# Patient Record
Sex: Female | Born: 1945 | Hispanic: Yes | Marital: Married | Smoking: Never smoker
Health system: Western US, Academic
[De-identification: ages and names within clinical notes are randomized; demographics above are authoritative.]

## PROBLEM LIST (undated history)

## (undated) DIAGNOSIS — I10 Essential (primary) hypertension: Secondary | ICD-10-CM

## (undated) DIAGNOSIS — E7800 Pure hypercholesterolemia, unspecified: Secondary | ICD-10-CM

## (undated) DIAGNOSIS — E039 Hypothyroidism, unspecified: Secondary | ICD-10-CM

## (undated) DIAGNOSIS — F32A Depression, unspecified: Secondary | ICD-10-CM

## (undated) DIAGNOSIS — E119 Type 2 diabetes mellitus without complications: Secondary | ICD-10-CM

## (undated) DIAGNOSIS — G4733 Obstructive sleep apnea (adult) (pediatric): Secondary | ICD-10-CM

## (undated) DIAGNOSIS — G510 Bell's palsy: Secondary | ICD-10-CM

## (undated) DIAGNOSIS — K219 Gastro-esophageal reflux disease without esophagitis: Secondary | ICD-10-CM

## (undated) DIAGNOSIS — F329 Major depressive disorder, single episode, unspecified: Secondary | ICD-10-CM

## (undated) HISTORY — PX: HYSTERECTOMY: AMBSHX0005

---

## 2016-11-24 LAB — HAPPY TOGETHER UNMAPPED RESULTS: aPTT: 27.6 second(s) — NL (ref 23.8–36.1)

## 2018-01-20 LAB — HAPPY TOGETHER UNMAPPED RESULTS: Albumin: 4.3 g/dL — NL (ref 3.7–5.7)

## 2018-05-07 ENCOUNTER — Emergency Department: Payer: Medicare (Managed Care)

## 2018-05-07 ENCOUNTER — Inpatient Hospital Stay: Payer: Self-pay

## 2018-05-07 ENCOUNTER — Inpatient Hospital Stay
Admission: EM | Admit: 2018-05-07 | Discharge: 2018-05-14 | DRG: 579 | Disposition: A | Discharge status: Home / Self Care | Payer: Medicare (Managed Care) | Attending: Internal Medicine | Admitting: Internal Medicine

## 2018-05-07 DIAGNOSIS — E860 Dehydration: Secondary | ICD-10-CM | POA: Insufficient documentation

## 2018-05-07 DIAGNOSIS — N179 Acute kidney failure, unspecified: Secondary | ICD-10-CM

## 2018-05-07 DIAGNOSIS — I129 Hypertensive chronic kidney disease with stage 1 through stage 4 chronic kidney disease, or unspecified chronic kidney disease: Secondary | ICD-10-CM

## 2018-05-07 DIAGNOSIS — E1065 Type 1 diabetes mellitus with hyperglycemia: Secondary | ICD-10-CM

## 2018-05-07 DIAGNOSIS — L728 Other follicular cysts of the skin and subcutaneous tissue: Secondary | ICD-10-CM

## 2018-05-07 DIAGNOSIS — R1084 Generalized abdominal pain: Secondary | ICD-10-CM

## 2018-05-07 DIAGNOSIS — Z9119 Patient's noncompliance with other medical treatment and regimen: Secondary | ICD-10-CM

## 2018-05-07 DIAGNOSIS — E1022 Type 1 diabetes mellitus with diabetic chronic kidney disease: Secondary | ICD-10-CM

## 2018-05-07 DIAGNOSIS — E1010 Type 1 diabetes mellitus with ketoacidosis without coma: Secondary | ICD-10-CM

## 2018-05-07 DIAGNOSIS — B9562 Methicillin resistant Staphylococcus aureus infection as the cause of diseases classified elsewhere: Secondary | ICD-10-CM

## 2018-05-07 DIAGNOSIS — G510 Bell's palsy: Secondary | ICD-10-CM

## 2018-05-07 DIAGNOSIS — R112 Nausea with vomiting, unspecified: Secondary | ICD-10-CM

## 2018-05-07 DIAGNOSIS — N189 Chronic kidney disease, unspecified: Secondary | ICD-10-CM

## 2018-05-07 DIAGNOSIS — L02811 Cutaneous abscess of head [any part, except face]: Secondary | ICD-10-CM

## 2018-05-07 HISTORY — DX: Obstructive sleep apnea (adult) (pediatric): G47.33

## 2018-05-07 HISTORY — DX: Pure hypercholesterolemia, unspecified: E78.00

## 2018-05-07 HISTORY — DX: Bell's palsy: G51.0

## 2018-05-07 HISTORY — DX: Depression, unspecified: F32.A

## 2018-05-07 HISTORY — DX: Hypothyroidism, unspecified: E03.9

## 2018-05-07 HISTORY — DX: Type 2 diabetes mellitus without complications: E11.9

## 2018-05-07 HISTORY — DX: Gastro-esophageal reflux disease without esophagitis: K21.9

## 2018-05-07 HISTORY — DX: Major depressive disorder, single episode, unspecified: F32.9

## 2018-05-07 HISTORY — DX: Essential (primary) hypertension: I10

## 2018-05-07 LAB — CBC WITH DIFFERENTIAL
Basophils % Auto: 0.2 % (ref 0.0–1.0)
Basophils Abs Auto: 0 10*3/uL (ref 0.0–0.1)
Eosinophils % Auto: 0.2 % (ref 0.0–4.0)
Eosinophils Abs Auto: 0 10*3/uL (ref 0.0–0.2)
Hematocrit: 36.5 % (ref 36.0–48.0)
Hematocrit: 26.5 % — ABNORMAL LOW (ref 36.0–48.0)
Hemoglobin: 12.4 g/dL (ref 12.0–16.0)
Hemoglobin: 9.2 g/dL — ABNORMAL LOW (ref 12.0–16.0)
Immature Granulocytes % Auto: 0.8 % — ABNORMAL HIGH (ref 0.00–0.50)
Immature Granulocytes Abs Auto: 0.1 10*3/uL — ABNORMAL HIGH (ref 0.0–0.0)
Lymphocytes % Auto: 7.6 % (ref 5.0–41.0)
Lymphocytes Abs Auto: 1.3 10*3/uL (ref 1.3–2.9)
MCH: 32.4 pg (ref 27.0–34.0)
MCH: 32.3 pg (ref 27.0–34.0)
MCHC g/dL: 34 g/dL (ref 33.0–37.0)
MCHC g/dL: 34.7 g/dL (ref 33.0–37.0)
MCV: 95.3 fL (ref 82.0–97.0)
MCV: 93 fL (ref 82.0–97.0)
MPV: 8.8 fL — ABNORMAL LOW (ref 9.4–12.4)
MPV: 8.5 fL — ABNORMAL LOW (ref 9.4–12.4)
Monocytes % Auto: 7.6 % (ref 0.0–10.0)
Monocytes Abs Auto: 1.3 10*3/uL — ABNORMAL HIGH (ref 0.3–0.8)
Neutrophils % Auto: 83.6 % — ABNORMAL HIGH (ref 45.0–75.0)
Neutrophils Abs Auto: 14.44 10*3/uL — ABNORMAL HIGH (ref 2.20–4.80)
Nucleated Cell Count: 0 10*3/uL (ref 0.0–0.1)
Nucleated Cell Count: 0 10*3/uL (ref 0.0–0.1)
Nucleated RBC/100 WBC: 0 % WBC (ref <=0.0)
Nucleated RBC/100 WBC: 0 % WBC (ref <=0.0)
Platelet Count: 393 10*3/uL — ABNORMAL HIGH (ref 151–365)
Platelet Count: 257 10*3/uL (ref 151–365)
RDW: 12.9 % (ref 11.5–14.5)
RDW: 12.8 % (ref 11.5–14.5)
Red Blood Cell Count: 3.83 10*6/uL (ref 3.80–5.10)
Red Blood Cell Count: 2.85 10*6/uL — ABNORMAL LOW (ref 3.80–5.10)
White Blood Cell Count: 24.6 10*3/uL — ABNORMAL HIGH (ref 4.2–10.8)
White Blood Cell Count: 17.3 10*3/uL — ABNORMAL HIGH (ref 4.2–10.8)

## 2018-05-07 LAB — POC GLUCOSE
POC GLUCOSE: 223 mg/dL — AB (ref 70–99)
POC GLUCOSE: 226 mg/dL — AB (ref 70–99)
POC GLUCOSE: 277 mg/dL — AB (ref 70–99)
POC GLUCOSE: 300 mg/dL — AB (ref 70–99)
POC GLUCOSE: 362 mg/dL — AB (ref 70–99)
POC GLUCOSE: 582 mg/dL — AB (ref 70–99)

## 2018-05-07 LAB — COMPREHENSIVE METABOLIC PANEL
ADJUSTED CALCIUM, MMC: 9.7 mg/dL (ref 8.7–10.2)
ALANINE TRANSFERASE (ALT): 16 U/L (ref 4–56)
ALB/GLOB RATIO_MMC: 1 (ref 1.0–1.6)
ALBUMIN: 4.1 g/dL (ref 3.2–4.7)
ALKALINE PHOSPHATASE (ALP): 121 U/L (ref 38–126)
ASPARTATE TRANSAMINASE (AST): 21 U/L (ref 9–44)
BILIRUBIN TOTAL: 1.9 mg/dL (ref 0.1–2.2)
BUN/CREATININE_MMC: 12.8 (ref 7.3–21.7)
CALCIUM: 9.8 mg/dL (ref 8.7–10.2)
CHLORIDE: 90 mmol/L — AB (ref 99–109)
CREATININE BLOOD: 1.88 mg/dL — AB (ref 0.50–1.30)
E-GFR, NON-AFRICAN AMERICAN: 26 mL/min/{1.73_m2} — AB (ref 60–?)
E-GFR_MMC: 30 mL/min/{1.73_m2} — AB (ref 60–?)
GLOBULIN BLOOD_MMC: 4.2 g/dL (ref 2.2–4.2)
GLUCOSE: 729 mg/dL — AB (ref 70–99)
POTASSIUM: 5.2 mmol/L (ref 3.5–5.2)
PROTEIN: 8.3 g/dL — AB (ref 5.9–8.2)
SODIUM: 128 mmol/L — AB (ref 134–143)
UREA NITROGEN, BLOOD (BUN): 24 mg/dL — AB (ref 6–21)

## 2018-05-07 LAB — BLD GAS VENOUS
BASE EXCESS, VENM_MMC: -22 mmol/L — AB (ref <=3)
HCO3, VENM_MMC: 6 mmol/L — AB (ref 22–27)
O2 SAT, VEN: 77 % (ref 70–100)
PATIENT TEMP, VENMMC: 37.2
PCO2, VEN: 19 mmHg — AB (ref 40–52)
PH, VEN: 7.1 — AB (ref 7.31–7.41)
PO2, VEN: 52 mmHg — AB (ref 30–50)

## 2018-05-07 LAB — MAGNESIUM (MG)
MAGNESIUM (MG): 2.1 mg/dL (ref 1.8–2.5)
MAGNESIUM (MG): 1.9 mg/dL (ref 1.8–2.5)

## 2018-05-07 LAB — MMC  MANUAL DIFFERENTIAL
BASOPHILS %: 1 %
BASOPHILS ABS: 0.2 10*3/uL — AB (ref 0.0–0.1)
LYMPHOCYTES ABS: 1 10*3/uL — AB (ref 1.3–2.9)
LYMPHS %: 4 %
MONOCYTES %: 4 %
MONOCYTES ABS: 1 10*3/uL — AB (ref 0.3–0.8)
NEUTROPHIL ABS: 22.4 10*3/uL — AB (ref 2.2–4.8)
PLATELET ESTIMATE, SMEAR: INCREASED
POLYS (SEGS)%: 91 %
RBC-COLOR, SIZE,SHAPE: NORMAL
SAMPLE SIZE, WBC: 100

## 2018-05-07 LAB — LACTIC ACID
LACTIC ACID_MMC: 3.5 mmol/L — AB (ref 0.5–1.9)
LACTIC ACID_MMC: 2.2 mmol/L — AB (ref 0.5–1.9)

## 2018-05-07 LAB — BASIC METABOLIC PANEL
BUN/CREATININE_MMC: 16 (ref 7.3–21.7)
CALCIUM: 8 mg/dL — AB (ref 8.7–10.2)
CARBON DIOXIDE TOTAL: 7 mmol/L — AB (ref 22–32)
CHLORIDE: 105 mmol/L (ref 99–109)
CREATININE BLOOD: 1.44 mg/dL — AB (ref 0.50–1.30)
E-GFR, NON-AFRICAN AMERICAN: 36 mL/min/{1.73_m2} — AB (ref 60–?)
E-GFR_MMC: 42 mL/min/{1.73_m2} — AB (ref 60–?)
GLUCOSE: 453 mg/dL — AB (ref 70–99)
POTASSIUM: 4.3 mmol/L (ref 3.5–5.2)
SODIUM: 134 mmol/L (ref 134–143)
UREA NITROGEN, BLOOD (BUN): 23 mg/dL — AB (ref 6–21)

## 2018-05-07 LAB — URINALYSIS AND CULTURE IF IND
BILIRUBIN URINE: NEGATIVE
GLUCOSE URINE: 500 mg/dL — AB
KETONES_MMC: 80 — AB
LEUK. ESTERASE: NEGATIVE
NITRITE URINE: NEGATIVE
PH URINE: 5 (ref 5.0–9.0)
PROTEIN URINE: NEGATIVE mg/dL
RBC: 1 /HPF (ref <=3)
SPECIFIC GRAVITY URINE MMC: 1.02 (ref 1.002–1.030)
UROBILINOGEN.: NEGATIVE mg/dL

## 2018-05-07 LAB — TROPONIN I
TROPONIN I_MMC: 0.03 ng/mL (ref <=0.03)
TROPONIN I_MMC: 0.04 ng/mL — AB (ref <=0.03)

## 2018-05-07 LAB — MMC TOTAL PARENTERAL NUTRITION CHEM PNL
Adjusted Calcium: 8.6 mg/dL — ABNORMAL LOW (ref 8.7–10.2)
Alanine Transferase (ALT): 12 U/L (ref 4–56)
Alb/Glob Ratio: 1 (ref 1.0–1.6)
Albumin: 3.2 g/dL (ref 3.2–4.7)
Alkaline Phosphatase (ALP): 81 U/L (ref 38–126)
Aspartate Transaminase (AST): 10 U/L (ref 9–44)
BUN/ Creatinine: 17.5 (ref 7.3–21.7)
Bilirubin Total: 1.3 mg/dL (ref 0.1–2.2)
Calcium: 8 mg/dL — ABNORMAL LOW (ref 8.7–10.2)
Carbon Dioxide Total: 16 mmol/L — ABNORMAL LOW (ref 22–32)
Chloride: 107 mmol/L (ref 99–109)
Creatinine Serum: 1.14 mg/dL (ref 0.50–1.30)
E-GFR, Non-African American: 48 mL/min/{1.73_m2} — ABNORMAL LOW (ref >60)
E-GFR: 55 mL/min/{1.73_m2} — ABNORMAL LOW (ref >60)
Globulin: 3.1 g/dL (ref 2.2–4.2)
Glucose: 281 mg/dL — ABNORMAL HIGH (ref 70–99)
Magnesium (Mg): 1.7 mg/dL — ABNORMAL LOW (ref 1.8–2.5)
Phosphorus (PO4): 1.5 mg/dL — ABNORMAL LOW (ref 2.4–4.7)
Potassium: 4.1 mmol/L (ref 3.5–5.2)
Prealbumin: 10 mg/dL — ABNORMAL LOW (ref 18–38)
Protein: 6.3 g/dL (ref 5.9–8.2)
Sodium: 135 mmol/L (ref 134–143)
Triglyceride: 149 mg/dL (ref 30–150)
Urea Nitrogen, Blood (BUN): 20 mg/dL (ref 6–21)

## 2018-05-07 LAB — MMC MRSA BY PCR: MRSA BY PCR_MMC: NOT DETECTED

## 2018-05-07 LAB — BLUE TOP TUBE

## 2018-05-07 LAB — BETA-HYDROXYBUTYRATE

## 2018-05-07 LAB — PHOSPHORUS (PO4): PHOSPHORUS (PO4): 5.9 mg/dL — AB (ref 2.4–4.7)

## 2018-05-07 MED ORDER — NACL 0.9% IV BOLUS - DURATION REQ
1000.00 mL | INTRAVENOUS | Status: AC
Start: 2018-05-07 — End: 2018-05-07
  Administered 2018-05-07 (×2): 1000 mL via INTRAVENOUS

## 2018-05-07 MED ORDER — POTASS.CHLOR 10 MEQ-LIDOCAINE 10 MG/100 ML IN 0.9 % SODCH IV PIGGYBACK
10.00 meq | INJECTION | INTRAVENOUS | Status: DC | PRN
Start: 2018-05-07 — End: 2018-05-14

## 2018-05-07 MED ORDER — D5 / 0.45% NACL IV INFUSION
INTRAVENOUS | Status: DC | PRN
Start: 2018-05-07 — End: 2018-05-08
  Administered 2018-05-08: 100 mL via INTRAVENOUS

## 2018-05-07 MED ORDER — ALUMINUM-MAG HYDROXIDE-SIMETHICONE 200 MG-200 MG-20 MG/5 ML ORAL SUSP
30.00 mL | ORAL | Status: DC | PRN
Start: 2018-05-07 — End: 2018-05-14

## 2018-05-07 MED ORDER — NALOXONE 0.4 MG/ML INJECTION SOLUTION
0.20 mg | INTRAMUSCULAR | Status: DC | PRN
Start: 2018-05-07 — End: 2018-05-09

## 2018-05-07 MED ORDER — MAGNESIUM SULFATE 1 GRAM/100 ML IN DEXTROSE 5 % INTRAVENOUS PIGGYBACK
1.00 g | INJECTION | INTRAVENOUS | Status: DC | PRN
Start: 2018-05-07 — End: 2018-05-14
  Administered 2018-05-07: 1 g via INTRAVENOUS
  Filled 2018-05-07: qty 100, fill #0

## 2018-05-07 MED ORDER — INSULIN U-100 REGULAR HUMAN 100 UNIT/ML INJECTION SOLUTION
15.00 [IU] | Freq: Once | INTRAMUSCULAR | Status: AC
Start: 2018-05-07 — End: 2018-05-07
  Administered 2018-05-07: 15 [IU] via INTRAVENOUS

## 2018-05-07 MED ORDER — SODIUM BICARBONATE 8.4 % (1 MEQ/ML) INTRAVENOUS SYRINGE
50.00 meq | INJECTION | Freq: Once | INTRAVENOUS | Status: AC
Start: 2018-05-07 — End: 2018-05-07
  Administered 2018-05-07: 50 meq via INTRAVENOUS
  Filled 2018-05-07: qty 50, fill #0

## 2018-05-07 MED ORDER — DOCUSATE CALCIUM 240 MG CAPSULE
240.00 mg | ORAL_CAPSULE | Freq: Every day | ORAL | Status: DC
Start: 2018-05-08 — End: 2018-05-09
  Administered 2018-05-08 – 2018-05-09 (×2): 240 mg via ORAL
  Filled 2018-05-07 (×2): qty 1, fill #0

## 2018-05-07 MED ORDER — PIPERACILLIN-TAZOBACTAM 4.5 GRAM/100 ML DEXTROSE(ISO-OSM) IV PIGGYBACK
4.50 g | INJECTION | Freq: Once | INTRAVENOUS | Status: AC
Start: 2018-05-07 — End: 2018-05-07
  Administered 2018-05-07: 4.5 g via INTRAVENOUS
  Filled 2018-05-07: qty 4500, fill #0

## 2018-05-07 MED ORDER — SODIUM PHOSPHATE 3 MMOL/ML INTRAVENOUS SOLUTION
22.50 mmol | INTRAVENOUS | Status: DC | PRN
Start: 2018-05-07 — End: 2018-05-14

## 2018-05-07 MED ORDER — FENTANYL (PF) 50 MCG/ML INJECTION SOLUTION
50.00 ug | Freq: Once | INTRAMUSCULAR | Status: AC
Start: 2018-05-07 — End: 2018-05-07
  Administered 2018-05-07: 50 ug via INTRAVENOUS
  Filled 2018-05-07: qty 2, fill #0

## 2018-05-07 MED ORDER — HYDROMORPHONE 1 MG/ML INJECTION SYRINGE
0.50 mg | INJECTION | INTRAMUSCULAR | Status: AC | PRN
Start: 2018-05-07 — End: 2018-05-08
  Administered 2018-05-07 – 2018-05-08 (×4): 0.5 mg via INTRAVENOUS
  Filled 2018-05-07 (×4): qty 1, fill #0

## 2018-05-07 MED ORDER — ONDANSETRON HCL (PF) 4 MG/2 ML INJECTION SOLUTION
4.00 mg | Freq: Once | INTRAMUSCULAR | Status: AC
Start: 2018-05-07 — End: 2018-05-07
  Administered 2018-05-07: 4 mg via INTRAVENOUS
  Filled 2018-05-07: qty 2, fill #0

## 2018-05-07 MED ORDER — ONDANSETRON HCL (PF) 4 MG/2 ML INJECTION SOLUTION
4.00 mg | Freq: Three times a day (TID) | INTRAMUSCULAR | Status: DC | PRN
Start: 2018-05-07 — End: 2018-05-14

## 2018-05-07 MED ORDER — DEXTROSE 50 % IN WATER (D50W) INTRAVENOUS SYRINGE
25.00 g | INJECTION | INTRAVENOUS | Status: DC | PRN
Start: 2018-05-07 — End: 2018-05-08

## 2018-05-07 MED ORDER — PIPERACILLIN-TAZOBACTAM 3.375 GRAM IV EXTENDED INFUSION (PREMADE)
3.38 g | INJECTION | Freq: Three times a day (TID) | INTRAVENOUS | Status: DC
Start: 2018-05-07 — End: 2018-05-10
  Administered 2018-05-07 – 2018-05-10 (×9): 3.375 g via INTRAVENOUS
  Filled 2018-05-07 (×9): qty 50, fill #0

## 2018-05-07 MED ORDER — PROMETHAZINE 25 MG/ML INJECTION SOLUTION
12.50 mg | Freq: Once | INTRAMUSCULAR | Status: AC
Start: 2018-05-07 — End: 2018-05-07
  Administered 2018-05-07: 12.5 mg via INTRAVENOUS
  Filled 2018-05-07: qty 1, fill #0

## 2018-05-07 MED ORDER — SODIUM CHLORIDE 0.9 % (FLUSH) INJECTION SYRINGE
2.50 mL | INJECTION | Freq: Three times a day (TID) | INTRAMUSCULAR | Status: DC
Start: 2018-05-07 — End: 2018-05-09
  Administered 2018-05-08 – 2018-05-09 (×4): 2.5 mL via INTRAVENOUS

## 2018-05-07 MED ORDER — BISACODYL 10 MG RECTAL SUPPOSITORY
10.00 mg | RECTAL | Status: DC | PRN
Start: 2018-05-07 — End: 2018-05-09

## 2018-05-07 MED ORDER — INSULIN REGULAR 100 UNIT/100 ML (1 UNIT/ML) IN 0.9 % NACL IV SOLUTION
1.00 [IU]/h | INTRAVENOUS | Status: DC
Start: 2018-05-07 — End: 2018-05-08
  Administered 2018-05-07: 3.375 [IU]/h via INTRAVENOUS
  Administered 2018-05-07: 6 [IU]/h via INTRAVENOUS

## 2018-05-07 MED ORDER — MELATONIN 3 MG TABLET
3.00 mg | ORAL_TABLET | Freq: Every day | ORAL | Status: DC | PRN
Start: 2018-05-07 — End: 2018-05-09
  Administered 2018-05-08: 3 mg via ORAL
  Filled 2018-05-07: qty 1, fill #0

## 2018-05-07 MED ORDER — VANCOMYCIN 1.25 GRAM/250 ML IN 0.9 % SODIUM CHLORIDE INTRAVENOUS
1.25 g | INTRAVENOUS | Status: DC
Start: 2018-05-08 — End: 2018-05-09
  Administered 2018-05-08: 1.25 g via INTRAVENOUS
  Filled 2018-05-07 (×2): qty 250, fill #0

## 2018-05-07 MED ORDER — VANCOMYCIN 1 GRAM/250 ML IN 0.9 % SODIUM CHLORIDE INTRAVENOUS
1.00 g | Freq: Once | INTRAVENOUS | Status: DC
Start: 2018-05-07 — End: 2018-05-07

## 2018-05-07 MED ORDER — GLUCAGON (HUMAN RECOMBINANT) 1 MG/ML SOLUTION FOR INJECTION
1.00 mg | INTRAMUSCULAR | Status: DC | PRN
Start: 2018-05-07 — End: 2018-05-08

## 2018-05-07 MED ORDER — POTASSIUM CHLORIDE 20 MEQ/100ML IN STERILE WATER INTRAVENOUS PIGGYBACK
20.00 meq | INJECTION | INTRAVENOUS | Status: DC | PRN
Start: 2018-05-07 — End: 2018-05-14

## 2018-05-07 MED ORDER — VANCOMYCIN 1.25 GRAM/250 ML IN 0.9 % SODIUM CHLORIDE INTRAVENOUS
1.25 g | Freq: Once | INTRAVENOUS | Status: AC
Start: 2018-05-07 — End: 2018-05-07
  Administered 2018-05-07: 1.25 g via INTRAVENOUS
  Filled 2018-05-07: qty 250, fill #0

## 2018-05-07 MED ORDER — PIPERACILLIN-TAZOBACTAM 3.375 GRAM/50 ML DEXTROSE(ISO-OS) IV PIGGYBACK
3.38 g | INJECTION | Freq: Four times a day (QID) | INTRAVENOUS | Status: DC
Start: 2018-05-07 — End: 2018-05-07

## 2018-05-07 MED ORDER — MAGNESIUM HYDROXIDE 400 MG/5 ML ORAL SUSPENSION
30.00 mL | ORAL | Status: DC | PRN
Start: 2018-05-07 — End: 2018-05-09

## 2018-05-07 MED ORDER — VANCOMYCIN RX TO DOSE (CC)
Status: DC
Start: 2018-05-07 — End: 2018-05-14

## 2018-05-07 MED ORDER — ACETAMINOPHEN 325 MG TABLET
650.00 mg | ORAL_TABLET | Freq: Four times a day (QID) | ORAL | Status: DC | PRN
Start: 2018-05-07 — End: 2018-05-14

## 2018-05-07 MED ORDER — ENOXAPARIN 40 MG/0.4 ML SUBCUTANEOUS SYRINGE
40.00 mg | INJECTION | SUBCUTANEOUS | Status: DC
Start: 2018-05-08 — End: 2018-05-09
  Administered 2018-05-08: 40 mg via SUBCUTANEOUS
  Filled 2018-05-07: qty 0.4, fill #0

## 2018-05-07 MED ORDER — SODIUM CHLORIDE 0.9 % (FLUSH) INJECTION SYRINGE
2.50 mL | INJECTION | INTRAMUSCULAR | Status: DC | PRN
Start: 2018-05-07 — End: 2018-05-09

## 2018-05-07 MED ORDER — LIDOCAINE HCL 10 MG/ML (1 %) INJECTION SOLUTION
0.20 mL | INTRAMUSCULAR | Status: DC | PRN
Start: 2018-05-07 — End: 2018-05-14
  Filled 2018-05-07: qty 10, fill #0

## 2018-05-07 MED ORDER — PANTOPRAZOLE 40 MG INTRAVENOUS SOLUTION
40.00 mg | Freq: Two times a day (BID) | INTRAVENOUS | Status: DC
Start: 2018-05-07 — End: 2018-05-11
  Administered 2018-05-07 – 2018-05-11 (×8): 40 mg via INTRAVENOUS
  Filled 2018-05-07: qty 40, fill #0
  Filled 2018-05-07: qty 40
  Filled 2018-05-07 (×6): qty 40, fill #0

## 2018-05-07 MED ORDER — NACL 0.9% IV INFUSION
INTRAVENOUS | Status: DC
Start: 2018-05-07 — End: 2018-05-08
  Administered 2018-05-07: 18:00:00 via INTRAVENOUS

## 2018-05-07 MED ORDER — VANCOMYCIN 1 GRAM/250 ML IN 0.9 % SODIUM CHLORIDE INTRAVENOUS
1.00 g | INTRAVENOUS | Status: DC
Start: 2018-05-07 — End: 2018-05-07

## 2018-05-07 MED ORDER — NACL 0.9% IV BOLUS - DURATION REQ
500.00 mL | Freq: Once | INTRAVENOUS | Status: AC
Start: 2018-05-07 — End: 2018-05-07
  Administered 2018-05-07: 500 mL via INTRAVENOUS

## 2018-05-07 MED ORDER — LACTOBACILLUS RHAMNOSUS GG 15 BILLION CELL SPRINKLE CAPSULE
1.00 | ORAL_CAPSULE | Freq: Two times a day (BID) | ORAL | Status: DC
Start: 2018-05-07 — End: 2018-05-09
  Administered 2018-05-07 – 2018-05-09 (×4): 1 via ORAL
  Filled 2018-05-07 (×4): qty 1, fill #0

## 2018-05-07 MED ORDER — DEXTROSE 50 % IN WATER (D50W) INTRAVENOUS SYRINGE
7.50 g | INJECTION | INTRAVENOUS | Status: DC | PRN
Start: 2018-05-07 — End: 2018-05-08

## 2018-05-07 MED ORDER — SODIUM PHOSPHATE 7.5 MMOL/100 ML IN 0.9 % SODIUM CHLORIDE IV SOLUTION
7.50 mmol | INTRAVENOUS | Status: DC | PRN
Start: 2018-05-07 — End: 2018-05-14

## 2018-05-07 MED ORDER — SODIUM PHOSPHATE 15 MMOL/250 ML IN 0.9 % SODIUM CHLORIDE IV SOLUTION
15.00 mmol | INTRAVENOUS | Status: DC | PRN
Start: 2018-05-07 — End: 2018-05-14
  Administered 2018-05-07: 15 mmol via INTRAVENOUS
  Filled 2018-05-07: qty 250, fill #0

## 2018-05-07 MED ORDER — INSULIN REGULAR 100 UNIT/100 ML (1 UNIT/ML) IN 0.9 % NACL IV SOLUTION
3.00 [IU]/h | INTRAVENOUS | Status: DC
Start: 2018-05-07 — End: 2018-05-07

## 2018-05-07 MED ORDER — NALOXONE 0.4 MG/ML INJECTION SOLUTION
0.08 mg | INTRAMUSCULAR | Status: DC | PRN
Start: 2018-05-07 — End: 2018-05-09

## 2018-05-07 MED FILL — ENOXAPARIN 40 MG/0.4 ML SUBCUTANEOUS SYRINGE: 40.00 mg | 0 days supply | Qty: 0 | Fill #0

## 2018-05-07 MED FILL — MAG-AL PLUS 200 MG-200 MG-20 MG/5 ML ORAL SUSPENSION: 30.00 mL | 0 days supply | Qty: 30 | Fill #0

## 2018-05-07 MED FILL — MYXREDLIN 100 UNIT/100 ML (1 UNIT/ML) INTRAVENOUS SOLUTION: 3.00 [IU]/h | 0 days supply | Qty: 100 | Fill #0

## 2018-05-07 NOTE — ED Nursing Note (Signed)
Urine collected by ER tech. Labeled sent to lab.

## 2018-05-07 NOTE — Plan of Care (Signed)
Problem: Patient Care Overview  Goal: Plan of Care Review  Outcome: Ongoing (interventions implemented as appropriate)  Flowsheets (Taken 05/07/2018 1955)  Plan of Care Reviewed With: patient  Progress: no change  Goal: Individualization and Mutuality  Outcome: Ongoing (interventions implemented as appropriate)  Flowsheets (Taken 05/07/2018 1955)  What Information Would Help [place] Give You More Personalized Care?: I can't think of anything.  What Anxieties, Fears, Concerns, or Questions Do You Have About Your Care?: Just worried about my husband because he uses a walker to get to the bathroom and needs help cleaning himself. Her brother is going to stay with him tonight.  Patient Specific Goals (Include Timeframe): Wants to get better quickly so she can go home and take care of her husband. She is his caregiver.  Patient Specific Interventions: Nothing really  How to Address Anxieties/Fears: You can't really do anything from here.  How Would You and/or Your Support Person Like to Participate in Your Care?: Just keep me up to date on the tx plan

## 2018-05-07 NOTE — ED Nursing Note (Addendum)
Assumed care of pt at this time. Pt on cardiac monitor. Pt just medicated for nausea and pain. Call light within reach. Pt appears to be in a lot of pain. Pt reminded for need for urine. Verbalizes understanding.

## 2018-05-07 NOTE — ED Nursing Note (Signed)
ER MD at bedside for eval.

## 2018-05-07 NOTE — ED Nursing Note (Signed)
Pt placed on cardiac monitor.  Rhythm strip printed and interpreted.

## 2018-05-07 NOTE — ED Nursing Note (Signed)
Pt insulin confirmed with Misty Stanley RN at pt bedside.

## 2018-05-07 NOTE — ED Nursing Note (Signed)
EKG at bedside. Will obtain insulin drip and begin hour monitoring of glucose.

## 2018-05-07 NOTE — ED Nursing Note (Signed)
Pt very anxious. Pt keeps trying to talk a lot. Pt encouraged to rest. 2L NC placed on pt. Pt encouraged to breathe through her nose. ER MD updated on pt status.

## 2018-05-07 NOTE — ED Nursing Note (Signed)
Report given to ICU, RN. RN informed of pt mental status, labs, and meds given. Informed of PICC line.

## 2018-05-07 NOTE — ED Nursing Note (Signed)
Pharmacy notified of BGL of [id number] to start insulin drip.

## 2018-05-07 NOTE — ED Nursing Note (Signed)
Per ER pharmacist, insulin drip to be held until glucose recheck per POC and metabolic panel. Insulin drip is off at this time. Will check glucose in 30 mins.

## 2018-05-07 NOTE — Progress Notes (Signed)
PROGRESS NOTE    At 05/07/18 5:17 PM, volume status and tissue perfusion assessment was performed for suspected severe sepsis or septic shock. Only one of the the two sections must be completed.    Section 1    Lungs - Normal    Heart - Normal    Cap Refill <2 secs    Radial Pulses- Palpable    Pedal Pulses- Palpable    Skin- Pale    Section 2    Bedside Ultrasound- No

## 2018-05-07 NOTE — ED Nursing Note (Signed)
Report to Natalie RN.

## 2018-05-07 NOTE — ED Nursing Note (Signed)
Pt voided approximately 3560 ml of urine, sample collected and sent to lab.

## 2018-05-07 NOTE — ED Nursing Note (Signed)
Pt placed on cardiac monitor and pulse ox.

## 2018-05-07 NOTE — ED Nursing Note (Signed)
Hospitalist at bedside. Pt son at bedside.

## 2018-05-07 NOTE — ED Provider Notes (Signed)
EMERGENCY DEPARTMENT PHYSICIAN NOTE - Zenaida Niece       Date of Service:   05/07/2018  1:25 PM Patient's PCP: Patient, No Pcp Per   Note Started: 05/07/2018 14:32 DOB: [birth year]             Chief Complaint   Patient presents with    Abdominal Pain    Nausea    Vomiting     The history provided by the patient and medical records.  Interpreter used: No    Diana Marquez is a 73yr old female, with a past medical history significant for type 1 diabetes on insulin, chronic kidney disease, hypertension, hyperlipidemia, OSA, right Bell's palsy, GERD, hypothyroidism and major depression, who presents to the ED with a chief complaint of mid abdominal pains with nausea and vomiting but no diarrhea and appears to be having labored breathing in triage.  Her symptoms apparently began to worsen with abdominal discomfort yesterday after she got back from a [facility] appointment in which they attempted to incise and drain an abscess on the back of her neck or lower scalp but apparently did not get much material out according to her.  She was started on Bactrim.  Prior to this apparently she had a topical antibiotic which appears to be mupirocin.  Symptoms worsened this morning.  The patient is a poor historian probably due to her condition.  She noted that her blood sugars were in the 600s this morning and checked it once yesterday and were elevated at around 300.  She reports that her last bowel movement was about 4 days ago but denies any black or bloody stools or constipation.  She has minimal emesis with her nausea and vomiting but not black or bloody.  Her abdominal discomfort is diffuse and somewhat crampy.  She apparently has not been eating since Wednesday.  She took only 12 units of her usual insulin this morning.  She feels hot but does not think she has a fever and denies any diaphoresis.  She continues to have significant pain at the abscess site in the lower right scalp and/or upper neck.  She denies any  associated dysuria or other urinary symptoms with some decreased urine output.  She has a dry mouth.  She denies any headache.  Though she is tachypneic she denies shortness of breath or cough or other upper respiratory symptoms.  No associated chest pain.  She is lightheaded but not near syncope.  She feels some associated fast palpitations.  No other changes in her medications.  No other rashes noted.    Quality: Ache  Location: Right posterior scalp/upper neck and also now general abdominal pain, crampy.  Severity: 10/10  Time Course: constant  Progression: unchanged  Duration: several days  Palliative factors: nothing makes it better.  Provocative factors: palpation and movement makes it worse.  Associated symptoms: see HPI   Pertinent negatives: see HPI and ROS     A full history, including pertinent past medical and social history was reviewed and updated as necessary.    HISTORY:  1    Abscess of scalp      Generalized abdominal pain      Acute kidney injury (HCC)      *Diabetic ketoacidosis without coma associated with type 1 diabetes mellitus (HCC)      Intractable vomiting with nausea, unspecified vomiting type      Dehydration     No Known Allergies   Past Medical History:  No date:  Bell's palsy  No date: Depression  No date: Diabetes (HCC)  No date: GERD (gastroesophageal reflux disease)  No date: HTN (hypertension)  No date: Hypercholesteremia  No date: Hypothyroid  No date: OSA (obstructive sleep apnea) Past Surgical History:  No date: Hysterectomy   Social History    Socioeconomic History      Marital status: MARRIED      Spouse name: Not on file      Number of children: Not on file      Years of education: Not on file      Highest education level: Not on file    Occupational History      Not on file    Social Needs      Financial resource strain: Not on file      Food insecurity:        Worry: Not on file        Inability: Not on file      Transportation needs:        Medical: Not on file         Non-medical: Not on file    Tobacco Use      Smoking status: Never Smoker      Smokeless tobacco: Never Used    Substance and Sexual Activity      Alcohol use: Yes        Alcohol/week: 1.0 standard drinks        Types: 1 Glasses of wine per week      Drug use: Never      Sexual activity: Not on file    Lifestyle      Physical activity:        Days per week: Not on file        Minutes per session: Not on file      Stress: Not on file    Relationships      Social connections:        Talks on phone: Not on file        Gets together: Not on file        Attends religious service: Not on file        Active member of club or organization: Not on file        Attends meetings of clubs or organizations: Not on file        Relationship status: Not on file      Intimate partner violence:        Fear of current or ex partner: Not on file        Emotionally abused: Not on file        Physically abused: Not on file        Forced sexual activity: Not on file    Other Topics      Concerns:        Not on file    Social History Narrative      Not on file   Review of patient's family history indicates:  Problem: Heart Disease      Relation: Brother          Age of Onset: (Not Specified)  Problem: Diabetes      Relation: Brother          Age of Onset: (Not Specified)         Physical Exam  Vitals signs and nursing note reviewed.   Constitutional:       General: She is in acute distress.  Appearance: Normal appearance. She is well-developed. She is ill-appearing. She is not toxic-appearing or diaphoretic.   HENT:      Head: Normocephalic and atraumatic.      Jaw: There is normal jaw occlusion.      Right Ear: External ear normal.      Left Ear: External ear normal.      Nose: Nose normal. No nasal deformity or rhinorrhea.      Mouth/Throat:      Lips: Pink.      Mouth: Mucous membranes are dry.      Dentition: Normal dentition.      Pharynx: Oropharynx is clear. Uvula midline. No pharyngeal swelling, oropharyngeal exudate, posterior  oropharyngeal erythema or uvula swelling.      Tonsils: No tonsillar exudate.   Eyes:      General: Lids are normal.      Extraocular Movements: Extraocular movements intact.      Conjunctiva/sclera: Conjunctivae normal.   Neck:      Musculoskeletal: Neck supple. Decreased range of motion. Pain with movement and muscular tenderness present. No neck rigidity, crepitus, injury or spinous process tenderness.      Thyroid: No thyromegaly.      Vascular: No JVD.      Trachea: Trachea and phonation normal.     Cardiovascular:      Rate and Rhythm: Regular rhythm. Tachycardia present.  No extrasystoles are present.     Pulses: Normal pulses.      Heart sounds: Normal heart sounds. No murmur. No gallop.    Pulmonary:      Effort: Tachypnea, accessory muscle usage and respiratory distress present.      Breath sounds: Normal breath sounds and air entry. No decreased breath sounds, wheezing, rhonchi or rales.   Chest:      Chest wall: No tenderness.   Abdominal:      General: Abdomen is flat. Bowel sounds are decreased. There is no distension.      Palpations: Abdomen is soft. There is no hepatomegaly, splenomegaly or mass.      Tenderness: There is generalized abdominal tenderness. There is no right CVA tenderness, left CVA tenderness, guarding or rebound. Negative signs include Murphy's sign, Rovsing's sign and McBurney's sign.      Hernia: No hernia is present.   Genitourinary:     Comments: Deferred  Musculoskeletal:         General: No tenderness or deformity.      Right lower leg: No edema.      Left lower leg: No edema.   Lymphadenopathy:      Cervical: No cervical adenopathy.   Skin:     General: Skin is warm and dry.      Capillary Refill: Capillary refill takes more than 3 seconds.      Coloration: Skin is pale. Skin is not cyanotic.      Findings: Abscess (See Neck) and lesion present. No erythema, signs of injury or rash.   Neurological:      General: No focal deficit present.      Mental Status: She is alert and  oriented to person, place, and time. She is confused.      GCS: GCS eye subscore is 4. GCS verbal subscore is 4. GCS motor subscore is 6.      Cranial Nerves: Cranial nerves are intact.      Sensory: Sensation is intact.      Motor: Motor function is intact.      Coordination:  Coordination is intact.      Gait: Gait is intact.   Psychiatric:         Attention and Perception: Attention and perception normal.         Mood and Affect: Affect normal. Mood is anxious.         Speech: Speech normal.         Behavior: Behavior normal. Behavior is cooperative.         Thought Content: Thought content normal.         Cognition and Memory: Cognition is impaired.       Review of Systems   Constitutional: Positive for appetite change and fatigue. Negative for chills, diaphoresis and fever.   HENT: Negative for congestion, postnasal drip, rhinorrhea, sneezing and sore throat.    Eyes: Negative for pain and visual disturbance.   Respiratory: Negative for cough, chest tightness, shortness of breath and wheezing.    Cardiovascular: Positive for palpitations. Negative for chest pain and leg swelling.   Gastrointestinal: Positive for abdominal pain, nausea and vomiting. Negative for blood in stool, constipation and diarrhea.   Endocrine: Positive for polydipsia. Negative for polyuria.   Genitourinary: Positive for decreased urine volume. Negative for difficulty urinating, dysuria, flank pain, hematuria and pelvic pain.   Musculoskeletal: Positive for myalgias. Negative for arthralgias, back pain and neck pain.   Skin: Positive for rash.   Allergic/Immunologic: Negative for environmental allergies and immunocompromised state.   Neurological: Positive for weakness (general not focal) and light-headedness. Negative for syncope, numbness and headaches.   Hematological: Negative for adenopathy.   Psychiatric/Behavioral: Negative.    All other systems reviewed and are negative.    TRIAGE VITAL SIGNS:  Temp: 36.4 C (97.5 F) (05/07/18  1318)  Temp src: Oral (05/07/18 1318)  Pulse: 128 (05/07/18 1318)  BP: (!) 158/95 (05/07/18 1318)  Resp: 24 (05/07/18 1318)  SpO2: 99 % (05/07/18 1318)  Weight: 71.7 kg (158 lb) (05/07/18 1318)    INITIAL ASSESSMENT & PLAN, MEDICAL DECISION MAKING, ED COURSE  Diana Marquez is a 19yr female who presents with a chief complaint of abdominal pain with nausea and vomiting and clinically is classic for DKA with a history of diabetes despite generally compliant with her insulin per her report at least until this morning.  WBC was elevated at 24.6 which could be related to the underlying infection but could also be simply a stress response of such severe DKA.  CBC was otherwise unremarkable.  Sodium showed a pseudohyponatremia with a markedly elevated glucose of 729.  Renal function was impaired at 1.88.  Lactic acid was elevated at 3.5.  Urinalysis was negative.  EKG showed no signs of ischemia with tachycardia and troponin was normal.  She had tenderness of the abdomen and CT of the abdomen showed no acute specific abnormalities.  Chest x-ray for her dyspnea did not show any acute abnormality either.  pH was low at 7.1 with a poorly compensated metabolic acidosis with a low PCO2 of 19.  She has an anion gap of more than 33.  She has serum ketones.  This is all consistent with DKA.  Potassium was reasonable at 5.2 and she was started on insulin with 15 units IV as a bolus and then continued on a drip.  She was given 1 amp of bicarb for her severe metabolic acidosis since her bicarb level was less than 5.  She seemed to be maintaining her respiratory status which was tachypneic here in the emergency  department but not hypoxic.  Lung sounds were clear.  Chest x-ray was unremarkable.  She was not febrile while here in the ED.  The source of her infection triggering the DKA is clearly the abscess.  She was covered with Zosyn and vancomycin after cultures.  She was treated with a 30 cc/kg bolus of normal saline given an  elevated lactic acid of 3.5 to cover potential sepsis.  While here in the ED she remained tachycardic though had some improvement and was intermittently tachypneic but generally maintained reasonable blood pressures while here.  She was never febrile.  She was treated for pain with Dilaudid with Zofran.  The patient will be admitted to the ICU by the hospitalist team for further treatment and evaluation.  She was not stable for transfer to [facility] for treatment at this time.  Surgery will will be notified regarding the abscess.    Differential includes, but is not limited to: The patient presents with abdominal pain. The differential diagnosis considered includes AAA, bowel infarction or ischemia, bowel obstruction, bowel perforation, appendicitis, peptic ulcer disease, diverticulitis, pancreatitis, referred pain from a cardiac or pulmonary source including MI, kidney stones or infection, urinary obstruction, biliary disease or infection, hepatitis, tumor related pain, functional causes for abdominal pain and pain related to other medical conditions.  In women differential also includes pelvic inflammatory disease, pregnancy related disorders including ectopic pregnancy, ovarian cyst or torsion or mass, endometriosis and fibroid tumors. The patient has signs of a skin infection. The differential includes: necrotizing fasciitis, abscess, lymphangitis, cellulitis, foreign body in skin, osteomyelitis or complications of swelling such as compartment syndrome or neurovascular compromise distally.  The patient presents with complaints of weakness. The differential diagnosis considered are numerous and includes acute sepsis and infectious causes, acute coronary syndrome and arrhythmia, electrolyte or hepatic,renal or thyroid disorders, neurologic disease or CVA, severe anemia, dehydration, diabetic emergencies, traumatic or spinal pathology as well as medication or drug reactions.     The results of the ED evaluation were  notable for the following:    Pertinent lab results:   Results for AUGUSTINE, BRANNICK [facility] (MRN [id number]) as of 05/10/2018 11:19   Ref. Range 05/07/2018 13:39 05/07/2018 13:41 05/07/2018 14:19 05/07/2018 15:04 05/07/2018 15:13 05/07/2018 15:26 05/07/2018 15:31 05/07/2018 16:27   Patient Temp, Ven Unknown     37.2      pH, VEN Latest Ref Range: 7.31 - 7.41      7.10 (Crtl)      PCO2, VEN Latest Ref Range: 40 - 52 mm Hg     19 (Crtl)      PO2, VEN Latest Ref Range: 30 - 50 mm Hg     52 (H)      O2 SAT, VEN Latest Ref Range: 70 - 100 %     77      HCO3, Ven Latest Ref Range: 22 - 27 mmol/L     6 (L)      Base Excess, Ven Latest Ref Range: -3 - 3 mmol/L     -22 (L)      FLOW TYPE, VEN Unknown     Room Air      SODIUM Latest Ref Range: 134 - 143 mmol/L 128 (L)          POTASSIUM Latest Ref Range: 3.5 - 5.2 mmol/L 5.2          CHLORIDE Latest Ref Range: 99 - 109 mmol/L 90 (L)          CARBON  DIOXIDE TOTAL Latest Ref Range: 22 - 32 mmol/L <5 (Crtl)          UREA NITROGEN, BLOOD (BUN) Latest Ref Range: 6 - 21 mg/dL 24 (H)          CREATININE BLOOD Latest Ref Range: 0.50 - 1.30 mg/dL 4.78 (H)          BUN/ Creatinine Latest Ref Range: 7.3 - 21.7  12.8          GLUCOSE Latest Ref Range: 70 - 99 mg/dL 295 (Crth)          CALCIUM Latest Ref Range: 8.7 - 10.2 mg/dL 9.8          Adjusted Calcium Latest Ref Range: 8.7 - 10.2 mg/dL 9.7          PROTEIN Latest Ref Range: 5.9 - 8.2 g/dL 8.3 (H)          ALBUMIN Latest Ref Range: 3.2 - 4.7 g/dL 4.1          ALKALINE PHOSPHATASE (ALP) Latest Ref Range: 38 - 126 U/L 121          ASPARTATE TRANSAMINASE (AST) Latest Ref Range: 9 - 44 U/L 21          BILIRUBIN TOTAL Latest Ref Range: 0.1 - 2.2 mg/dL 1.9          ALANINE TRANSFERASE (ALT) Latest Ref Range: 4 - 56 U/L 16          E-GFR, NON-AFRICAN AMERICAN Latest Ref Range: >60 mL/min/1.47m*2 26 (L)          E-GFR Latest Ref Range: >60 mL/min/1.91m*2 30 (L)          PHOSPHORUS (PO4) Latest Ref Range: 2.4 - 4.7 mg/dL 5.9 (H)          Globulin Latest Ref Range:  2.2 - 4.2 g/dL 4.2          Alb/Glob Ratio Latest Ref Range: 1.0 - 1.6  1.0          MAGNESIUM (MG) Latest Ref Range: 1.8 - 2.5 mg/dL 2.1          Lactic Acid Latest Ref Range: 0.5 - 1.9 mmol/L 3.5 (Crth)          Troponin [facility] Latest Ref Range: <=0.03 ng/mL 0.03          White Blood Cell Count Latest Ref Range: 4.2 - 10.8 K/MM3 24.6 (H)          Red Blood Cell Count Latest Ref Range: 3.80 - 5.10 M/MM3 3.83          Hemoglobin Latest Ref Range: 12.0 - 16.0 g/dL 62.1          Hematocrit Latest Ref Range: 36.0 - 48.0 % 36.5          MCV Latest Ref Range: 82.0 - 97.0 fL 95.3          MCH Latest Ref Range: 27.0 - 34.0 pg 32.4          MCHC g/dL Latest Ref Range: 30.8 - 37.0 g/dL 65.7          RDW Latest Ref Range: 11.5 - 14.5 % 12.9          Platelet Count Latest Ref Range: 151 - 365 K/MM3 393 (H)          MPV Latest Ref Range: 9.4 - 12.4 fL 8.8 (L)          Nucleated RBC/100 WBC Latest Ref Range: <=  0.0 % WBC 0.0          Nucleated Cell Count Latest Ref Range: 0.0 - 0.1 K/MM3 0.0          Polys (Segs) % Latest Units: % 91          Lymphocytes % Latest Units: % 4          Monocytes % Latest Units: % 4          Basophils % Latest Units: % 1          Neutrophil Abs Latest Ref Range: 2.2 - 4.8 K/MM3 22.4 (H)          Lymphocytes Abs Latest Ref Range: 1.3 - 2.9 K/MM3 1.0 (L)          Monocytes Abs Latest Ref Range: 0.3 - 0.8 K/MM3 1.0 (H)          Basophils Abs Latest Ref Range: 0.0 - 0.1 K/MM3 0.2 (H)          Sample Size, WBC Unknown 100          RBC- Color, Size, Shape Unknown Normal          Polychromasia Unknown Occasional          Platelet Estimate, Smear Unknown Increased          [facility] CULTURE, BLOOD Unknown Rpt Rpt         CULTURE URINE, BACTI Unknown    Rpt       POC GLUCOSE Latest Ref Range: 70 - 99 mg/dL        161 (Abnl)   COLOR Latest Ref Range: Yellow     Yellow       CLARITY Latest Ref Range: Clear     Clear       pH URINE Latest Ref Range: 5.0 - 9.0     5.0       Specific Gravity  Latest Ref Range: 1.002 -  1.030     1.020       Occult Blood Urine Latest Ref Range: Negative     Small (Abnl)       BILIRUBIN URINE Latest Ref Range: Negative     Negative       Ketones Latest Ref Range: Negative     80 (Abnl)       GLUCOSE URINE Latest Ref Range: Negative mg/dL    096 (Abnl)       PROTEIN URINE Latest Ref Range: Negative mg/dL    Negative       UROBILINOGEN. Latest Ref Range: Negative mg/dL    Negative       NITRITE URINE Latest Ref Range: Negative     Negative       LEUK. ESTERASE Latest Ref Range: Negative     Negative       RBC Latest Ref Range: <=3 /HPF    1       SQUAMOUS EPI Unknown    <1       MUCOUS/LPF Latest Ref Range: Negative, Few /HPF    Few       [facility] CT ABDOMEN PELVIS WITHOUT CONTRAST Unknown      Rpt     [facility] CHEST 2 VIEW Unknown   Rpt        QTC Latest Units: ms       464    ELECTROCARDIOGRAM WITH RHYTHM STRIP Unknown       Attch    Ketones Latest Ref Range: Negative  Moderate (Abnl)            Radiology reads:      [facility] Ct Abdomen Pelvis W/o Contrast    Result Date: 05/07/2018  [facility] CT ABDOMEN PELVIS WITHOUT CONTRAST INDICATION: 73 year old female with vomiting, abdominal pain. Leukocytosis. COMPARISON: None. TECHNIQUE: Unenhanced CT images of the abdomen and pelvis were obtained and reconstructed in multiple planes. Some images are degraded by motion. FINDINGS: LOWER THORAX:  No pleural or pericardial effusions are identified. The visualized bilateral lung bases appear clear. Small fat-containing eventrations are identified at the posterior right hemidiaphragm as seen on axial series 3 image 13. CT ABDOMEN: LIVER AND SPLEEN:  No discrete hepatic or splenic abnormality is detected. GALLBLADDER, COMMON BILE DUCT AND PANCREAS:  The gallbladder is within normal limits.  The common bile duct is normal in diameter.  The pancreas is grossly unremarkable accounting for motion artifact. ADRENALS AND KIDNEYS:  The left adrenal is unremarkable.  The right adrenal is unremarkable.  No discrete renal mass lesion,  hydronephrosis or nephrolithiasis is seen. AORTA AND RETROPERITONEUM:  Mild scattered atherosclerotic calcifications are identified at the abdominal aorta without aneurysmal dilatation. There is mild flattening of the IVC.  No retroperitoneal mass or adenopathy. STOMACH AND SMALL BOWEL:  The stomach contains a small amount of fluid and gas, otherwise unremarkable.  Unopacified small bowel loops normal in caliber. CT PELVIS: REPRODUCTIVE ORGANS:  The uterus is not definitely appreciated. No discrete adnexal abnormality is identified. URETERS AND URINARY BLADDER:  The urinary bladder within normal limits.  The right ureter is unremarkable.  The left ureter is unremarkable. ADENOPATHY/MASSES:  No pelvic sidewall, obturator nor inguinal adenopathy. RECTUM AND COLON:  There is mild distal colonic diverticulosis without evidence of acute diverticulitis. Small to moderate stool is noted within the colon. Moderate loose enteric contents are identified within the cecum and ascending colon on coronal image 15. TERMINAL ILEUM AND APPENDIX:  The terminal ileum is within normal limits.  There appears to be a short appendix on coronal image 18, which is otherwise unremarkable.Marland Kitchen VENTRAL WALL:  No umbilical, ventral wall nor inguinal hernias. FREE FLUID/AIR:  No free fluid, no free air. BONES:  No acute osseous abnormality is seen. Prominent posterior facet arthropathy is identified at the lumbar spine. 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 There is levoconvex curvature of the lumbar spine. Severe discogenic degenerative changes are identified at L1-L2 and L2-L3. There is nonacute-appearing loss of height at the inferior endplate of vertebral body L1 best appreciated on sagittal image 36. There is grade 1 anterolisthesis of L4 on L5, probably degenerative.     IMPRESSION: 1.  No acute intra-abdominal or pelvic abnormality identified. Normal appendix. 2.  Moderate loose enteric contents within the cecum and ascending colon may  be secondary to low-grade enteritis. No evidence of bowel obstruction. 3.  Mild distal colonic diverticulosis without evidence of acute diverticulitis. Radiation dose report: CTDIvol 13.18 mGy DLP 676.9 mGy*cm All CT scans at [facility] and [facility] are performed using dose optimization techniques as appropriate to a performed exam including the following: automated exposure control, adjustment of the mA and/or kV according to patient size, and/or use of iterative reconstruction technique.     [facility] Chest 2 Views    Result Date: 05/07/2018  DATE of Exam:  05/07/2018 14:15 INDICATION: Female, age 59 years.  Respiratory distress. COMPARISON: None TECHNIQUE:  2 views of the chest were obtained. FINDINGS:  The lungs are clear. The cardiomediastinal silhouette is  within normal limits. No acute osseous abnormality is seen.     IMPRESSION: No acute cardiopulmonary process is identified.     EKG (reviewed and interpreted independently by me): The rhythm is Sinus Tach, rate 116, axis -22, no ST/T changes, normal EKG, normal sinus rhythm, there are no previous tracings available for comparison, QTC 0.464.    Medications   Lidocaine (XYLOCAINE) 10 mg/mL (1%) Injection 0.2 mL (has no administration in time range)   Acetaminophen (TYLENOL) Tablet 650 mg (has no administration in time range)   Alum-Mag Hydroxide-Simeth (MAALOX) 200-200-20 mg/5 mL Suspension 30 mL (has no administration in time range)   Ondansetron (ZOFRAN) Injection 4 mg (has no administration in time range)   Magnesium Sulfate 1 gram in D5W 100 mL IVPB (0 g IV Stopped 05/07/18 2356)   Potassium Chloride 20 mEq/100 mL in Sterile Water IVPB 20 mEq (has no administration in time range)     Or   Potassium Chloride 10 mEq  / Lidocaine 10 mg in 0.9% NaCl 100mL IVPB (has no administration in time range)   Sodium Phosphate 7.5 mmol in NaCl 0.9% 100 mL IVPB (has no administration in time range)   Sodium Phosphate 15 mmol in NaCl 0.9% 250 mL  IVPB (0 mmol IV Stopped 05/08/18 0458)   Sodium Phosphates 22.5 mmol in NaCl 0.9% 250 mL IVPB (has no administration in time range)   Vancomycin Pharmacy to Dose (has no administration in time range)   Piperacillin/Tazobactam (ZOSYN) 3.375 gram in Dextrose 50 mL IVPB 4 HOUR Extended Infusion (0 g IV Stopped 05/10/18 1507)   Pantoprazole (PROTONIX) Injection 40 mg (40 mg IV Given 05/10/18 0611)   NaCl 0.45% Infusion (1,000 mL IV New bag/syringe 05/09/18 0631)   Dextrose 50% Injection 25 g (has no administration in time range)   Glucagon Injection 1 mg (has no administration in time range)   Insulin Lispro (HUMALOG) Injection 0-15 Units (6 Units SUBCUTANEOUS Given 05/10/18 0818)   Insulin Glargine (LANTUS) 100 unit/mL Injection 20 Units (20 Units SUBCUTANEOUS Given 05/09/18 1958)   Duloxetine (CYMBALTA) Delayed Release Capsule 60 mg (60 mg ORAL Given 05/10/18 0818)   Atorvastatin (LIPITOR) Tablet 40 mg (40 mg ORAL Given 05/09/18 2008)   LevoTHYROxine Tablet 100 mcg (100 mcg ORAL Given 05/10/18 0611)   FamoTIDine (PEPCID) Tablet 20 mg (20 mg ORAL Given 05/10/18 0818)   Saline Lock Flush 2.5 mL (2.5 mL IV Given 05/10/18 1109)   Saline Lock Flush 2.5 mL (has no administration in time range)   Lactated Ringers Infusion ( IV New bag/syringe 05/10/18 0349)   Lactobacillus (CULTURELLE) Sprinkle Capsule 1 Sprinkle Capsule (1 Sprinkle Capsule ORAL Given 05/10/18 0611)   Naloxone (NARCAN) Injection 0.08 mg (has no administration in time range)   Naloxone (NARCAN) Injection 0.2 mg (has no administration in time range)   Docusate Calcium (COLACE) Capsule 240 mg (240 mg ORAL Given 05/10/18 0818)   Magnesium Hydroxide (MILK OF MAGNESIA) 400 mg/5 mL Suspension 30 mL (has no administration in time range)   Bisacodyl (DULCOLAX) Suppository 10 mg (has no administration in time range)   Melatonin Tablet 3 mg (has no administration in time range)   Enoxaparin (LOVENOX) Injection 40 mg (40 mg SUBCUTANEOUS Given 05/10/18 0818)   Vancomycin (VANCOCIN) 1,250 mg in NaCl  (iso-osm) 250 mL IVPB (0 g IV Stopped 05/10/18 0615)   Acetaminophen (TYLENOL) Tablet 325 mg (has no administration in time range)   Ketorolac (TORADOL) Injection 15 mg (15 mg IV Given 05/09/18 2339)  Hydrocodone 5 mg/Acetaminophen 325 mg (NORCO) Tablet 2 tablet (2 tablets ORAL Given 05/10/18 0346)   Hydromorphone (DILAUDID) Injection 0.5 mg (has no administration in time range)   Fentanyl (SUBLIMAZE) Injection 50 mcg (50 mcg IV Given 05/07/18 1355)   Ondansetron (ZOFRAN) Injection 4 mg (4 mg IV Given 05/07/18 1356)   Piperacillin-Tazobactam (ZOSYN) 4.5 g in Dextrose (iso-osm) 100 mL IV (0 g IV Stopped 05/07/18 1604)   Insulin Regular Injection 15 Units (15 Units IV Given 05/07/18 1535)   NaCl 0.9% Bolus 1,000 mL (0 mL IV Stopped 05/07/18 1656)   NaCl 0.9% Bolus 500 mL (0 mL IV Stopped 05/07/18 1530)   Promethazine (PHENERGAN) Injection 12.5 mg (12.5 mg IV Given 05/07/18 1506)   Hydromorphone (DILAUDID) Injection 0.5 mg (0.5 mg IV Given 05/08/18 0049)   Sodium Bicarbonate 8.4% (1 mEq/mL) Injection 50 mEq (50 mEq IV Given 05/07/18 1512)   Vancomycin (VANCOCIN) 1,250 mg in NaCl (iso-osm) 250 mL IVPB (0 g IV Stopped 05/07/18 1705)   FENTANYL (PF) 50 MCG/ML INJECTION SOLUTION (has no administration in time range)   FENTANYL (PF) 50 MCG/ML INJECTION SOLUTION (has no administration in time range)     Critical Care Note: There was a high probability of life-threatening clinical deterioration in the patient's condition requiring my urgent intervention.  Total critical care time with the patient was at least 45 minutes spent exclusively with the patient, excluding separately billable procedures.  Critical care was required due to patient's acute DKA with abnormal vital signs and severe metabolic derangement requiring frequent repeat clinical reevaluation's to assess respiratory status, hemodynamic status in the setting of even potential sepsis and repeat metabolic parameters primarily blood sugar and acidosis while here in the emergency  department.    Consults: A Consult was obtained from the [facility] (Dr.Obrien) to evaluate for management. They recommend admission here till stabilized. I discussed the case with Dr. Thedore Mins for admission.  Auth# [id number] Consults Medical Sub-Specialty: Called(Marleen @ [facility]) (05/07/18 1550), Surgical Sub-Specialty: Called(Dr. Liborio Nixon informed) (05/07/18 1728)    Chart Review: I reviewed the patient's prior medical records. Pertinent information that is relevant to this encounter was a review of the prior encounters both ED and outpatient available in the electronic records as well as any previous pertinant labs or imaging if available.    Patient Summary: The patient presents with severe DKA and metabolic acidosis with incomplete respiratory compensation triggered by a scalp/neck abscess.  The patient could potentially have sepsis given her clinical findings and was treated with septic boluses of fluid and antibiotics after culture.  She was also treated with insulin and bicarb while here in the emergency department for her severe DKA.  She will be admitted to the ICU by the hospitalist team with surgical consultation for the abscess for further treatment and evaluation.    LAST VITAL SIGNS:  Temp: 36.1 C (97 F) (05/10/18 0722)  Temp src: Temporal (05/10/18 0722)  Pulse: 89 (05/10/18 0722)  BP: 146/84 (05/10/18 0722)  Resp: 18 (05/10/18 0722)  SpO2: 94 % (05/10/18 0722)  Weight: 72.7 kg (160 lb 4.4 oz) (05/07/18 1845)       Clinical Impression:     ICD-10-CM    1. Diabetic ketoacidosis without coma associated with type 1 diabetes mellitus (HCC) E10.10 ADMIT TO HOSPITAL- INPATIENT STATUS     ADMIT TO HOSPITAL- INPATIENT STATUS   2. Abscess of scalp L02.811 ADMIT TO HOSPITAL- INPATIENT STATUS     ADMIT TO HOSPITAL- INPATIENT STATUS  Surgical Case Request: EXCISION, LESION, SCALP     Surgical Case Request: EXCISION, LESION, SCALP     [facility] CULTURE, AEROBIC     [facility] CULTURE, AEROBIC     [facility]  CULTURE, ANAEROBIC     [facility] CULTURE, ANAEROBIC     [facility] CULTURE, AEROBIC     [facility] CULTURE, AEROBIC     SURGICAL PATHOLOGY     SURGICAL PATHOLOGY   3. Generalized abdominal pain R10.84 ADMIT TO HOSPITAL- INPATIENT STATUS     ADMIT TO HOSPITAL- INPATIENT STATUS   4. Intractable vomiting with nausea, unspecified vomiting type R11.2    5. Dehydration E86.0    6. Acute kidney injury (HCC) N17.9 ADMIT TO HOSPITAL- INPATIENT STATUS     ADMIT TO HOSPITAL- INPATIENT STATUS     Disposition: Admit    PATIENT'S GENERAL CONDITION:  Serious: Vital signs may be unstable and not within normal limits. Patient is acutely ill. Indicators are questionable.    Electronically signed by: Bertell Maria, MD

## 2018-05-07 NOTE — ED Nursing Note (Signed)
ED pharmacist to speak with MD about changing insulin order for weight based dosing.

## 2018-05-07 NOTE — ED Nursing Note (Signed)
Called house sup, PICC RN will come shortly.

## 2018-05-07 NOTE — ED Nursing Note (Signed)
ED lab called. Lab will have to draw pt labs. Both IV lines in use.

## 2018-05-07 NOTE — Progress Notes (Signed)
VANCOMYCIN PER PHARMACY INITIAL NOTE  SUBJECTIVE/OBJECTIVE  Christen Gaddy is a 73yr old female  INDICATION: ICU sepsis, scalp abscess  VANCOMYCIN TROUGH GOAL: 15-20 mcg/mL    ALLERGIES: Patient has no known allergies.    White Blood Cell Count   Date Value Ref Range Status   05/07/2018 24.6 (H) 4.2 - 10.8 K/MM3 Final     Urea Nitrogen, Blood (BUN)   Date Value Ref Range Status   05/07/2018 23 (H) 6 - 21 mg/dL Final     Creatinine Serum   Date Value Ref Range Status   05/07/2018 1.44 (H) 0.50 - 1.30 mg/dL Final     Estimated Creatinine Clearance: 32.2 mL/min (A) (by C-G formula based on SCr of 1.44 mg/dL (H)).  No results found for: VANCO  Temp: 36.4 C (97.5 F) (02/06 1318)  Temp src: Oral (02/06 1318)  Pulse: 122 (02/06 1632)  BP: 158/82 (02/06 1632)  Resp: 26 (02/06 1526)  SpO2: 98 % (02/06 1632)  Height: 157.5 cm (5\' 2" ) (02/06 1318)  Weight: 71.7 kg (158 lb) (02/06 1318)  Temp (24hrs), Avg:36.4 C (97.5 F), Min:36.4 C (97.5 F), Max:36.4 C (97.5 F)      OTHER ANTIBIOTIC THERAPY: Zosyn  OTHER NEPHROTOXIC MEDS: Zosyn    ASSESSMENT/PLAN  Begin Vancomycin 1250 mg loading dose (17.4mg /kg) in ED, followed by Vancomycin 1250 mg (17.4 mg/kg) IV q24H.  No trough level ordered at this time as pt is currently dehydrated and in DKA. Expect fluid status changes over next 24-48 hours.   Pharmacy to continue to follow patient daily per protocol.

## 2018-05-07 NOTE — ED Nursing Note (Signed)
Pt sts that her blood sugar was over 600 this am. Pt took 12 units of insulin.

## 2018-05-07 NOTE — ED Triage Note (Signed)
Pt reports she had an abscess a few weeks ago on her neck that she had removed. When she came home it was worse. On Thursday the pt developed bad mid abdominal pains, vomit x 2, denies diarrhea.  Pt arrives in triage short of breath with labored breathing.

## 2018-05-07 NOTE — ED Nursing Note (Signed)
Pt offered additional pain medicine. Pt declines at this time. Will continue to monitor.

## 2018-05-07 NOTE — ED Nursing Note (Signed)
Dr. Jodean Lima notified of pt critical values.

## 2018-05-07 NOTE — ED Nursing Note (Signed)
Pt repositioned in bed. Non slip socks now on pt. Pt appears a little altered at times. Upon redirection, pt answers orientation questions.

## 2018-05-07 NOTE — ED Nursing Note (Signed)
Per ER pharmacist, start Zosyn prior to Vanco.

## 2018-05-07 NOTE — ED Nursing Note (Signed)
House supervisor contacted for PICC line

## 2018-05-07 NOTE — ED Nursing Note (Signed)
Picc line nurse bedside starting PICC

## 2018-05-07 NOTE — ED Nursing Note (Signed)
Pt sent to the lobby at this time. Pt instructed to please let the front desk know if symptoms get worse or change. Pt stable for the lobby at this time.

## 2018-05-07 NOTE — H&P (Signed)
HOSPITALIST ADMISSION HISTORY & PHYSICAL  Date of Service: 05/07/2018 Patient's PCP: Patient, No Pcp Per         CHIEF COMPLAINT: Nausea, vomiting, neck pain patient is a poor historian son is a good historian    HISTORY OF PRESENT ILLNESS: This [age]yr female with past medical history of type 1 diabetes, hypertension, hyperlipidemia, hypothyroidism, chronic kidney disease who [facility] underwent incision and drainage of the scalp abscess on the right side of the neck.  Looks like he was not a complete incision and drainage.  Today patient comes in with nausea and vomiting and abdominal pain and scalp and neck pain.  Patient's work-up shows she is in diabetic ketoacidosis with a pH of 7.1 and bicarb of 19 and moderate ketones in the blood sugar of 582.  Patient was referred for admission.  Patient was started on IV fluids and IV antibiotics and a surgical consult is pending.  In the meantime patient is also getting a PICC line and be given a liter of fluids and admitted to the ICU.    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  #2 hypertension  #3 weakness of the right arm  #4 sleep apnea  #5 chronic kidney disease  #6 chronic neck pain  #7 right Bell's palsy  #8 noncompliance  #9 major depression  #10 GERD      Surgical history   Hysterectomy    Family history  One brother has diabetes he is 74 years old, another brother has heart disease had a CABG, 1 son is healthy .640       Social history  Married, does not smoke or drink    Allergies: No Known Allergies      Prior to Admission Medications:    Prior to Admission medications    Not on File        14 point review systems is completely negative except for HPI and the following: Unreliable  Review of Systems    VITAL SIGNS:  Summary  Temp Min: 36.4 C (97.5 F) Max: 36.4 C (97.5 F)  BP: (158-196)/(80-99)   Pulse Min: 113 Max: 128  Resp Min: 24 Max: 26  SpO2 Min: 98 % Max: 100 %       Current  Vitals  Temp: 36.4 C (97.5 F)  BP: 158/82  Pulse: 122  Resp: 26  SpO2: 98 %      Weight: 71.7 kg (158 lb)  Body surface area is 1.77 meters squared.  Body mass index is 28.9 kg/m.    Physical Exam   Elderly female, looks her age a little anxious and short of breath  Head-atraumatic normocephalic  Neck-patient has a redness and an abscess on the right infra occipital area with mild fluctuation  Oral exam mucosa dry  Lungs-clear to auscultation  CVS-S1-S2 tachycardia regular rhythm no murmurs gallop  Abdomen-soft, nontender palpation, no hepatosplenomegaly, no guarding, no rebound  Extremities-no cyanosis, no clubbing, no edema  Neuro-no gross neurological deficits  Lymphatic-without adenopathy      IMAGING  [facility] CT ABDOMEN PELVIS W/O CONTRAST   Final Result   IMPRESSION:    1.  No acute intra-abdominal or pelvic abnormality identified. Normal appendix.   2.  Moderate loose enteric contents within the cecum and ascending colon may be secondary to low-grade enteritis. No evidence of bowel obstruction.   3.  Mild distal colonic diverticulosis without evidence of acute diverticulitis.  Radiation dose report:   CTDIvol 13.18 mGy   DLP 676.9 mGy*cm      All CT scans at [facility] are performed using dose optimization techniques as appropriate to a performed exam including the following: automated exposure control, adjustment of the mA and/or kV according to patient size, and/or use of iterative reconstruction technique.         [facility] CHEST 2 VIEWS   Final Result   IMPRESSION:   No acute cardiopulmonary process is identified.                   LAB TESTS  Results for orders placed or performed during the hospital encounter of 05/07/18   CBC WITH DIFFERENTIAL   Result Value Ref Range    White Blood Cell Count 24.6 (H) 4.2 - 10.8 K/MM3    Red Blood Cell Count 3.83 3.80 - 5.10 M/MM3    Hemoglobin 12.4 12.0 - 16.0 g/dL    Hematocrit 35.7 01.7 - 48.0 %    MCV 95.3  82.0 - 97.0 fL    MCH 32.4 27.0 - 34.0 pg    MCHC g/dL 79.3 90.3 - 00.9 g/dL    RDW 23.3 00.7 - 62.2 %    MPV 8.8 (L) 9.4 - 12.4 fL    Platelet Count 393 (H) 151 - 365 K/MM3    Nucleated RBC/100 WBC 0.0 <=0.0 % WBC    Nucleated Cell Count 0.0 0.0 - 0.1 K/MM3   COMPREHENSIVE METABOLIC PANEL   Result Value Ref Range    Sodium 128 (L) 134 - 143 mmol/L    Potassium 5.2 3.5 - 5.2 mmol/L    Chloride 90 (L) 99 - 109 mmol/L    Carbon Dioxide Total <5 (Crtl) 22 - 32 mmol/L    Urea Nitrogen, Blood (BUN) 24 (H) 6 - 21 mg/dL    Creatinine Serum 6.33 (H) 0.50 - 1.30 mg/dL    BUN/ Creatinine 35.4 7.3 - 21.7    Glucose 729 (Crth) 70 - 99 mg/dL    Calcium 9.8 8.7 - 56.2 mg/dL    Adjusted Calcium 9.7 8.7 - 10.2 mg/dL    Protein 8.3 (H) 5.9 - 8.2 g/dL    Albumin 4.1 3.2 - 4.7 g/dL    Alkaline Phosphatase (ALP) 121 38 - 126 U/L    Aspartate Transaminase (AST) 21 9 - 44 U/L    Bilirubin Total 1.9 0.1 - 2.2 mg/dL    Alanine Transferase (ALT) 16 4 - 56 U/L    Globulin 4.2 2.2 - 4.2 g/dL    Alb/Glob Ratio 1.0 1.0 - 1.6    E-GFR 30 (L) >60 mL/min/1.44m*2    E-GFR, Non-African American 26 (L) >60 mL/min/1.65m*2   LACTIC ACID   Result Value Ref Range    Lactic Acid 3.5 (Crth) 0.5 - 1.9 mmol/L   URINALYSIS AND CULTURE IF IND   Result Value Ref Range    Color Yellow Yellow    Clarity Clear Clear    Specific Gravity  1.020 1.002 - 1.030    pH Urine 5.0 5.0 - 9.0    Protein Negative Negative mg/dL    Glucose 563 (Abnl) Negative mg/dL    Ketones 80  (Abnl) Negative    Bilirubin Negative Negative    Urobilinogen Negative Negative mg/dL    Occult Blood  Small (Abnl) Negative    Leukocyte Esterase Negative Negative    Nitrite Negative Negative    RBC 1 <=3 /HPF  MUCOUS Few Negative, Few /HPF    Squamous Epithelial Cells <1    TROPONIN I   Result Value Ref Range    Troponin [facility] 0.03 <=0.03 ng/mL   BETA-HYDROXYBUTYRATE   Result Value Ref Range    Ketones Moderate (Abnl) Negative   MAGNESIUM (MG)   Result Value Ref Range    Magnesium (Mg) 2.1 1.8  - 2.5 mg/dL   PHOSPHORUS (PO4)   Result Value Ref Range    Phosphorus (PO4) 5.9 (H) 2.4 - 4.7 mg/dL   POC GLUCOSE   Result Value Ref Range    POC GLUCOSE 582 (Abnl) 70 - 99 mg/dL    POC GLUCOSE MACHINE ID      POC GLUCOSE STRIP LOT#     BLD GAS VENOUS   Result Value Ref Range    Flow Type, Ven Room Air     Patient Temp, Ven 37.2     pH, Ven 7.10 (Crtl) 7.31 - 7.41    pCO2, Ven 19 (Crtl) 40 - 52 mm Hg    pO2, Ven 52 (H) 30 - 50 mm Hg    O2 Sat, Ven 77 70 - 100 %    HCO3, Ven 6 (L) 22 - 27 mmol/L    Base Excess, Ven -22 (L) -3 - 3 mmol/L   [facility]  MANUAL DIFFERENTIAL   Result Value Ref Range    Polys (Segs) % 91 %    Lymphocytes % 4 %    Monocytes % 4 %    Basophils % 1 %    Sample Size, WBC 100     Neutrophil Abs 22.4 (H) 2.2 - 4.8 K/MM3    Lymphocytes Abs 1.0 (L) 1.3 - 2.9 K/MM3    Monocytes Abs 1.0 (H) 0.3 - 0.8 K/MM3    Basophils Abs 0.2 (H) 0.0 - 0.1 K/MM3    RBC- Color, Size, Shape Normal     Platelet Estimate, Smear Increased     Polychromasia Occasional    BLUE TOP TUBE   Result Value Ref Range    Extra Tube Hold for add-ons.        ASSESSMENT & PLAN:  Diabetic ketoacidosis without coma associated with type 1 diabetes mellitus (HCC) (05/07/2018)     Assessment: Likely due to infection patient has a scalp abscess     Plan: Admit to ICU, will start on IV insulin drip, IV fluids, IV antibiotics, PICC line, lactic acid is 3.2 will follow serial lactic acids.  Patient will have a surgical consultation also.  We will check her bicarbs every  4 hours with a cam panel.  Also has been noncompliant with her medications.    Abscess of scalp (05/07/2018)   broad-spectrum antibiotics  Surgical consult    Generalized abdominal pain (05/07/2018)     Assessment: CT scan abdomen pelvis is negative     Plan: Most likely patient will be on IV Protonix, n.p.o. most likely acidosis is the reason for her abdominal pain    Acute kidney injury (HCC) (05/07/2018)     Assessment: Due to sepsis and dehydration     Plan: IV fluids    Intractable  vomiting with nausea, unspecified vomiting type (05/07/2018)     Assessment: Likely due to diabetic ketoacidosis     Plan: Treat the acidosis, IV Zofran, IV fluids    Dehydration (05/07/2018)  Aggressive hydration with IV fluids.      I anticipate the patient's stay to be greater than two midnights.  She has  diabetic ketoacidosis and nausea and vomiting unable to keep anything down.  She needs IV fluids IV antibiotics insulin drip she will be admitted to ICU.  Patient will require more than 48 hours in the hospital.    Critical care time is over 31 minutes    DVT Prophylaxis: Enoxaparin (LOVENOX)    Code status: Full    Report Electronically Signed by:  Augustina Mood, MD

## 2018-05-07 NOTE — Procedures (Signed)
PICC Line Insertion  Date/Time: 05/07/2018 5:06 PM  Performed by: Durene Cal, RN  Authorized by: Augustina Mood, MD     Consent:     Consent obtained:  Verbal    Consent given by:  Patient    Risks discussed:  Infection, malposition and thrombus  Universal protocol:     Procedure explained and questions answered to patient or proxy's satisfaction: yes      Relevant documents present and verified: yes      Test results available and properly labeled: yes      Imaging studies available: yes      Required blood products, implants, devices, and special equipment available: yes      Site/side marked: yes      Immediately prior to procedure, a time out was called: yes      Patient identity confirmed:  Verbally with patientPatient location at time of insertion: [facility] ED  Provider Information:     procedure documented by performing provider: Yes       Inserter occupation:  RN    Is inserter a member of the PICC/IV team?: yes    Pre-procedure details:     Reason for insertion: severe sepsis      Hygiene: Inserter performed hand hygiene      Maximal sterile barriers used: yes    Barriers used: sterile gloves, mask , gown, sterile sheet, cap and goggles or eye shields      Skin preparation:  Chlorhexidine    Skin preparation dried: Skin preparation agent completely dried prior to procedure      Patient position:  Flat (recumbent)  Anesthesia (see MAR for exact dosages):     Anesthesia method:  Local infiltration    Local anesthetic:  Lidocaine 1% w/o epi  Procedure details:     Site:  Basilic vein, left      Catheter Inserted:  Triple lumen, valved power PICCLot #: [id number].    Catheter size:  5 Fr    Landmarks identified: yes      Ultrasound guidance: yes      Sterile ultrasound techniques: Sterile gel and sterile probe covers were used      Number of attempts:  1    Successful placement: yes    Post-procedure details: PICC internal measurement (distance advanced): 40    PICC external measurement:  5Arm girth 10 cm above the  joint line:27  Arm girth 10 cm below the joint line:22    Post-procedure:  Dressing applied    Assessment:  Blood return through all ports, no obvious complications, placement verified by Sherlock 3CG and positive blood return    Patient tolerance of procedure:  Tolerated well, no immediate complicationsDate/time procedure completed: 05/07/2018 5:09 PM  Comments:      1600-1700 vein ratio 42%. No bleeding at site. Left arm vein larger then left.

## 2018-05-08 LAB — BASIC METABOLIC PANEL
BUN/CREATININE_MMC: 16.8 (ref 7.3–21.7)
BUN/CREATININE_MMC: 18.8 (ref 7.3–21.7)
BUN/CREATININE_MMC: 16.3 (ref 7.3–21.7)
CALCIUM: 8.1 mg/dL — AB (ref 8.7–10.2)
CALCIUM: 7.9 mg/dL — AB (ref 8.7–10.2)
CALCIUM: 8 mg/dL — AB (ref 8.7–10.2)
CARBON DIOXIDE TOTAL: 19 mmol/L — AB (ref 22–32)
CARBON DIOXIDE TOTAL: 18 mmol/L — AB (ref 22–32)
CARBON DIOXIDE TOTAL: 18 mmol/L — AB (ref 22–32)
CHLORIDE: 110 mmol/L — AB (ref 99–109)
CHLORIDE: 107 mmol/L (ref 99–109)
CHLORIDE: 103 mmol/L (ref 99–109)
CREATININE BLOOD: 1.01 mg/dL (ref 0.50–1.30)
CREATININE BLOOD: 0.85 mg/dL (ref 0.50–1.30)
CREATININE BLOOD: 0.98 mg/dL (ref 0.50–1.30)
E-GFR, NON-AFRICAN AMERICAN: 55 mL/min/{1.73_m2} — AB (ref 60–?)
E-GFR, NON-AFRICAN AMERICAN: 68 mL/min/{1.73_m2} (ref 60–?)
E-GFR, NON-AFRICAN AMERICAN: 57 mL/min/{1.73_m2} — AB (ref 60–?)
E-GFR_MMC: 64 mL/min/{1.73_m2} (ref 60–?)
E-GFR_MMC: 79 mL/min/{1.73_m2} (ref 60–?)
E-GFR_MMC: 66 mL/min/{1.73_m2} (ref 60–?)
GLUCOSE: 207 mg/dL — AB (ref 70–99)
GLUCOSE: 189 mg/dL — AB (ref 70–99)
GLUCOSE: 243 mg/dL — AB (ref 70–99)
POTASSIUM: 3.8 mmol/L (ref 3.5–5.2)
POTASSIUM: 3.7 mmol/L (ref 3.5–5.2)
POTASSIUM: 3.7 mmol/L (ref 3.5–5.2)
SODIUM: 136 mmol/L (ref 134–143)
SODIUM: 135 mmol/L (ref 134–143)
SODIUM: 132 mmol/L — AB (ref 134–143)
UREA NITROGEN, BLOOD (BUN): 17 mg/dL (ref 6–21)
UREA NITROGEN, BLOOD (BUN): 16 mg/dL (ref 6–21)
UREA NITROGEN, BLOOD (BUN): 16 mg/dL (ref 6–21)

## 2018-05-08 LAB — POC GLUCOSE
POC GLUCOSE: 166 mg/dL — AB (ref 70–99)
POC GLUCOSE: 181 mg/dL — AB (ref 70–99)
POC GLUCOSE: 188 mg/dL — AB (ref 70–99)
POC GLUCOSE: 217 mg/dL — AB (ref 70–99)
POC GLUCOSE: 221 mg/dL — AB (ref 70–99)
POC GLUCOSE: 275 mg/dL — AB (ref 70–99)
POC GLUCOSE: 276 mg/dL — AB (ref 70–99)

## 2018-05-08 LAB — SED RATE WESTERGREN: SED RATE WESTERGREN: 88 mm/h — AB (ref 0–25)

## 2018-05-08 LAB — ELECTROCARDIOGRAM WITH RHYTHM STRIP: QTC: 464 ms

## 2018-05-08 LAB — APTT STUDIES
APTT Ratio: <0.8 — ABNORMAL LOW (ref 0.8–1.2)
aPTT: 23.5 secs — ABNORMAL LOW (ref 24.0–37.0)

## 2018-05-08 LAB — MMC PROTHROMBIN TIME/INR
INR: 0.91 (ref 0.88–1.12)
PROTHROMBIN TIME: 10.6 s (ref 10.2–13.1)

## 2018-05-08 MED ORDER — ATORVASTATIN 40 MG TABLET
40.00 mg | ORAL_TABLET | Freq: Every day | ORAL | Status: DC
Start: 2018-05-08 — End: 2018-05-14
  Administered 2018-05-08 – 2018-05-13 (×6): 40 mg via ORAL
  Filled 2018-05-08: qty 1, fill #0
  Filled 2018-05-08 (×2): qty 1
  Filled 2018-05-08 (×2): qty 1, fill #0
  Filled 2018-05-08: qty 1

## 2018-05-08 MED ORDER — NACL 0.45% IV INFUSION
INTRAVENOUS | Status: DC
Start: 2018-05-08 — End: 2018-05-11
  Administered 2018-05-08: 125 mL via INTRAVENOUS
  Administered 2018-05-08: 14:00:00 via INTRAVENOUS
  Administered 2018-05-08 – 2018-05-09 (×2): 1000 mL via INTRAVENOUS

## 2018-05-08 MED ORDER — LEVOTHYROXINE 100 MCG TABLET
100.00 ug | ORAL_TABLET | Freq: Every day | ORAL | Status: DC
Start: 2018-05-08 — End: 2018-05-14
  Administered 2018-05-08 – 2018-05-14 (×7): 100 ug via ORAL
  Filled 2018-05-08 (×4): qty 1

## 2018-05-08 MED ORDER — HYDROCODONE 5 MG-ACETAMINOPHEN 325 MG TABLET
1.00 | ORAL_TABLET | ORAL | Status: DC | PRN
Start: 2018-05-08 — End: 2018-05-09
  Administered 2018-05-08 – 2018-05-09 (×4): 1 via ORAL
  Filled 2018-05-08 (×4): qty 1, fill #0

## 2018-05-08 MED ORDER — DULOXETINE 30 MG CAPSULE,DELAYED RELEASE
60.00 mg | DELAYED_RELEASE_CAPSULE | Freq: Every day | ORAL | Status: DC
Start: 2018-05-08 — End: 2018-05-14
  Administered 2018-05-08 – 2018-05-14 (×7): 60 mg via ORAL
  Filled 2018-05-08: qty 2
  Filled 2018-05-08 (×3): qty 2, fill #0
  Filled 2018-05-08 (×3): qty 2

## 2018-05-08 MED ORDER — INSULIN GLARGINE 100 UNIT/ML SUB-Q VIAL
10.00 [IU] | Freq: Two times a day (BID) | SUBCUTANEOUS | Status: DC
Start: 2018-05-08 — End: 2018-05-08
  Administered 2018-05-08: 10 [IU] via SUBCUTANEOUS
  Filled 2018-05-08: qty 1000, fill #0

## 2018-05-08 MED ORDER — KETOROLAC 30 MG/ML (1 ML) INJECTION SOLUTION
15.00 mg | Freq: Four times a day (QID) | INTRAMUSCULAR | Status: DC | PRN
Start: 2018-05-08 — End: 2018-05-09
  Administered 2018-05-08 – 2018-05-09 (×4): 15 mg via INTRAVENOUS
  Filled 2018-05-08 (×4): qty 1, fill #0

## 2018-05-08 MED ORDER — INSULIN LISPRO (U-100) 100 UNIT/ML SUBCUTANEOUS SOLUTION
0.00 [IU] | Freq: Four times a day (QID) | SUBCUTANEOUS | Status: DC
Start: 2018-05-08 — End: 2018-05-14
  Administered 2018-05-08: 2 [IU] via SUBCUTANEOUS
  Administered 2018-05-08: 4 [IU] via SUBCUTANEOUS
  Administered 2018-05-08 (×2): 8 [IU] via SUBCUTANEOUS
  Administered 2018-05-09: 15 [IU] via SUBCUTANEOUS
  Administered 2018-05-09: 2 [IU] via SUBCUTANEOUS
  Administered 2018-05-09: 10 [IU] via SUBCUTANEOUS
  Administered 2018-05-10: 6 [IU] via SUBCUTANEOUS
  Administered 2018-05-10: 8 [IU] via SUBCUTANEOUS
  Administered 2018-05-10 (×2): 2 [IU] via SUBCUTANEOUS
  Administered 2018-05-11: 12 [IU] via SUBCUTANEOUS
  Administered 2018-05-11 (×2): 10 [IU] via SUBCUTANEOUS
  Administered 2018-05-12: 6 [IU] via SUBCUTANEOUS
  Administered 2018-05-12: 10 [IU] via SUBCUTANEOUS
  Administered 2018-05-12 – 2018-05-13 (×3): 2 [IU] via SUBCUTANEOUS
  Administered 2018-05-13: 8 [IU] via SUBCUTANEOUS
  Filled 2018-05-08: qty 300, fill #0

## 2018-05-08 MED ORDER — GLUCAGON (HUMAN RECOMBINANT) 1 MG/ML SOLUTION FOR INJECTION
1.00 mg | INTRAMUSCULAR | Status: DC | PRN
Start: 2018-05-08 — End: 2018-05-14
  Filled 2018-05-08: qty 1

## 2018-05-08 MED ORDER — INSULIN GLARGINE 100 UNIT/ML SUB-Q VIAL
20.00 [IU] | Freq: Every day | SUBCUTANEOUS | Status: DC
Start: 2018-05-08 — End: 2018-05-10
  Administered 2018-05-08 – 2018-05-09 (×2): 20 [IU] via SUBCUTANEOUS

## 2018-05-08 MED ORDER — FAMOTIDINE 20 MG TABLET
20.00 mg | ORAL_TABLET | Freq: Two times a day (BID) | ORAL | Status: DC
Start: 2018-05-08 — End: 2018-05-14
  Administered 2018-05-08 – 2018-05-14 (×12): 20 mg via ORAL
  Filled 2018-05-08 (×6): qty 1
  Filled 2018-05-08 (×2): qty 1, fill #0
  Filled 2018-05-08: qty 1
  Filled 2018-05-08 (×3): qty 1, fill #0

## 2018-05-08 MED ORDER — DEXTROSE 50 % IN WATER (D50W) INTRAVENOUS SYRINGE
25.00 g | INJECTION | INTRAVENOUS | Status: DC | PRN
Start: 2018-05-08 — End: 2018-05-14

## 2018-05-08 MED FILL — SYNTHROID 100 MCG TABLET: 100.00 ug | 0 days supply | Qty: 1 | Fill #0

## 2018-05-08 NOTE — Best Poss Rx Hx (Signed)
Best Possible Medication History (BPMH) Note   Diana Marquez   05/08/2018  09:39  (A) [facility] Hospitalist    Sources:   Primary:   Institutional Document (Name: [facility]  Records; number: Care Everywhere)   Secondary:   Family Member (Name: Duwayne Heck (daughter); number: [phone])  Additional:   N/A    Patient/Caregiver Understanding of Medications:   Low (at most can identify medications by name or indication but not both, has little understanding of dose) Pt recovering from DKA, somewhat confused (not yet at baseline)    Follow-up/Pending Medication Entries:  Further Review Needed: None    Considerations for Discharge  None at time of admission.    Medication List:   Prior to Admission Medications   Prescriptions   Aspirin 81 mg EC Tablet   Sig: Take 81 mg by mouth every morning.   Atenolol (TENORMIN) 50 mg Tablet   Sig: Take 50 mg by mouth every day.   Atorvastatin (LIPITOR) 40 mg tablet   Sig: Take 40 mg by mouth every day at bedtime.   Duloxetine (CYMBALTA) 20 mg Delayed Release Capsule   Sig: Take 60 mg by mouth every day.   FamoTIDine (PEPCID) 20 mg Tablet   Sig: Take 20 mg by mouth 2 times daily.   Hydrocodone 5 mg/Acetaminophen 325 mg (NORCO) Tablet   Sig: Take 1 tablet by mouth every 12 hours.   Insulin Glargine (LANTUS U-100 INSULIN) 100 unit/mL Vial   Sig: Inject 27 Units subcutaneously every day at bedtime.   Insulin Lispro (HUMALOG U-100 INSULIN) 100 unit/mL Vial   Sig: Indications: type 1 diabetes mellitus Per calculated meal doses + sliding scale   LevoTHYROxine 100 mcg Tablet   Sig: Take 100 mcg by mouth every morning before a meal.   Mupirocin (BACTROBAN) 2 % Ointment   Sig: Apply to the affected area 3 times daily. Indications: inflammation of a hair follicle   Trimethoprim 160 mg/Sulfamethoxazole 800 mg (BACTRIM DS) Tablet   Sig: Take 1 tablet by mouth 2 times daily. Indications: folliculitis                 Facility-Administered Medications: None       Added:   All meds, doses and  instructions (previously listed as "No medications")    Removed (patient reported no longer current or incomplete entry):   None    Changes to Dose/Rate/Frequency:   None    Total number of medications on list after [facility]PM[facility]: 51    Educated the patient and/or caregiver on the importance of carrying with them an accurate and up-to-date medication list.     This note is documentation that a Best Possible Medication History (BPMH) has been completed using the most reliable sources at the moment. Information may be subject to change.   Above information communicated to provider for review.     Darrold Junker, RPh  Pager: [phone]    Estimated total time spent: 30 min    High Risk Score: 3.2  High Risk Criteria: Not high risk but previously no records for patient.

## 2018-05-08 NOTE — Anesthesia Preprocedure Evaluation (Addendum)
Anesthesia Evaluation    History of Present Illness  73 y/o female with DM type1 with scalp abscess presenting for I&D.  Currently has elevated WBC, malnutrition (prealbumin 10), anemia (Hg 9), normal K and Cr, glucose 170's (700+ on admission, presented in diabetic ketoacidosis- now resolved)    Current abx- Zosyn/Vancomycin    H/O HTN, hypothyroidism, DMI insulin dependent, GERD  Airway   Mallampati: II  TM distance: >3 FB   Comment:  Small mouth opening  Neck ROM is full.     Dental    (+) chipped, broken and cracked       Pulmonary   (+) sleep apnea,     Patient's breath sounds clear to auscultation. Cardiovascular - cardiovascular exam normal  (+) hypertension (well controlled),    (-) past MI, dysrhythmias, angina, DOE    ECG reviewed  Rhythm: regular  Rate: normal  Patient has good exercise tolerance.   Neuro/Psych      Neuro/Psych ROS comment: Major depression   GI/Hepatic/Renal   (+) GERD (takes omeprazole- taken this am) (),         Abdominal    Endo/Other    (+) diabetes mellitus (FBG 136 in pre-op (117 at 0100)) poorly controlled type 1,     Comments: hypothyroidism  Smoking History  No history of smoking                            Anesthesia Plan    ASA 3     MAC   (Risks, benefits and alternative anesthetics discussed. Patient expresses understanding and accepts risks of general anesthesia not limited to heart, brain, lung and or kidney injury and or death, intraoperative awareness and or recall, eye, nerve or dental injury, sore throat and or post op nausea and vomiting. Patient accepts risks and agrees to proceed with anesthetic plan.  )  Patient did not smoke on day of procedure.  Education provided regarding risk of obstructive sleep apnea.  Intravenous induction    Anesthetic plan and risks discussed with patient.  I personally performed a physical assessment on this patient.    Electronically signed by  Wendie Simmer, D.O., Associate Physician     [facility]

## 2018-05-08 NOTE — Plan of Care (Signed)
Problem: Patient Care Overview  Goal: Plan of Care Review  Outcome: Ongoing (interventions implemented as appropriate)  Goal: Individualization and Mutuality  Outcome: Ongoing (interventions implemented as appropriate)  Goal: Discharge Needs Assessment  Outcome: Ongoing (interventions implemented as appropriate)  Goal: Interprofessional Rounds/Family Conf  Outcome: Ongoing (interventions implemented as appropriate)     Problem: Skin Injury Risk (Adult)  Goal: Identify Related Risk Factors and Signs and Symptoms  Description  Related risk factors and signs and symptoms are identified upon initiation of Human Response Clinical Practice Guideline (CPG).  Outcome: Ongoing (interventions implemented as appropriate)  Goal: Skin Health and Integrity  Description  Patient will demonstrate the desired outcomes by discharge/transition of care.  Outcome: Ongoing (interventions implemented as appropriate)     Problem: Diabetes, Type 1 (Adult)  Goal: Signs and Symptoms of Listed Potential Problems Will be Absent, Minimized or Managed (Diabetes, Type 1)  Description  Signs and symptoms of listed potential problems will be absent, minimized or managed by discharge/transition of care (reference Diabetes, Type 1 (Adult) CPG).  Outcome: Ongoing (interventions implemented as appropriate)     Problem: Fall Risk (Adult)  Goal: Identify Related Risk Factors and Signs and Symptoms  Description  Related risk factors and signs and symptoms are identified upon initiation of Human Response Clinical Practice Guideline (CPG).  Outcome: Ongoing (interventions implemented as appropriate)  Goal: Absence of Fall  Description  Patient will demonstrate the desired outcomes by discharge/transition of care.  Outcome: Ongoing (interventions implemented as appropriate)     Problem: Pain, Acute (Adult)  Goal: Identify Related Risk Factors and Signs and Symptoms  Description  Related risk factors and signs and symptoms are identified upon initiation of  Human Response Clinical Practice Guideline (CPG).  Outcome: Ongoing (interventions implemented as appropriate)  Goal: Acceptable Pain Control/Comfort Level  Description  Patient will demonstrate the desired outcomes by discharge/transition of care.  Outcome: Ongoing (interventions implemented as appropriate)     Problem: Infection, Risk/Actual (Adult)  Goal: Identify Related Risk Factors and Signs and Symptoms  Description  Related risk factors and signs and symptoms are identified upon initiation of Human Response Clinical Practice Guideline (CPG).  Outcome: Ongoing (interventions implemented as appropriate)  Goal: Infection Prevention/Resolution  Description  Patient will demonstrate the desired outcomes by discharge/transition of care.  Outcome: Ongoing (interventions implemented as appropriate)     Problem: Renal Failure/Kidney Injury, Acute (Adult)  Goal: Signs and Symptoms of Listed Potential Problems Will be Absent, Minimized or Managed (Renal Failure/Kidney Injury, Acute)  Description  Signs and symptoms of listed potential problems will be absent, minimized or managed by discharge/transition of care (reference Renal Failure/Kidney Injury, Acute (Adult) CPG).  Outcome: Ongoing (interventions implemented as appropriate)     Problem: Nausea/Vomiting (Adult)  Goal: Identify Related Risk Factors and Signs and Symptoms  Description  Related risk factors and signs and symptoms are identified upon initiation of Human Response Clinical Practice Guideline (CPG).  Outcome: Ongoing (interventions implemented as appropriate)  Goal: Symptom Relief  Description  Patient will demonstrate the desired outcomes by discharge/transition of care.  Outcome: Ongoing (interventions implemented as appropriate)  Goal: Adequate Hydration  Description  Patient will demonstrate the desired outcomes by discharge/transition of care.  Outcome: Ongoing (interventions implemented as appropriate)

## 2018-05-08 NOTE — Allied Health Progress (Signed)
CASE MANAGEMENT DAILY NOTE    Received call from Jan at [facility] [phone]. She states that they will not need to transfer patient and she remain here for her care at this time. Patient notified.     Chart reviewed and Case Management to follow and assist with discharge planning and inpatient management as needed.     Jobe Igo, Case Manager     Ext # [phone]  05/08/2018  14:27          CASE MANAGEMENT DAILY NOTE    Spoke with Jan at [facility] [phone] and gave her updated report on the patient.  Spoke with patient who would prefer to stay here if possible.  [facility] notified    Patient was given a handout informing them of surrounding skilled nursing facility or home health care choices and their star ratings with contact information?  no    Chart reviewed and Case Management to follow and assist with discharge planning and inpatient management as needed.     Jobe Igo, Case Manager     Ext # [phone]  05/08/2018  13:08         CASE MANAGEMENT INITIAL PATIENT SCREEN    Patient: Diana Marquez                                    MRN: [id number]  Date of Birth: [birth year] (46yr)                       Gender: female  PCP: Patient, No Pcp Per                             Date of Initial Screen:  05/08/2018 @ 10:58    Type of Contact: Face to Face    Date of Admission: Inpatient Admission Date/Time: 05/07/2018 4:31 PM      Recent ED or Admission Dates:            Insurance Status: Payor: [employer] / Plan: [facility]IOR [employer]/MEDICARE RISK / Product Type: *No Product type* /     [facility] or have they ever been in the past?   no    Chief Complaint:  Abdominal Pain; Nausea; and Vomiting      Admitting Diagnosis:   Diabetic ketoacidosis without coma associated with type 1 diabetes mellitus (HCC) [E10.10]  Abscess of scalp [L02.811]  Generalized abdominal pain [R10.84]  Intractable vomiting with nausea, unspecified vomiting type [R11.2]  Dehydration [E86.0]  Acute kidney injury (HCC)  [N17.9]     Consults:   GENERAL SURGERY CONSULT    Procedures:     Social Determinants: Pt lives with husband Merton Border and together they support one another.  There is a walker in the home and husband is currently receiving home health support for wound care to his foot.  Pt has daughter Legrand Como who is helpful and available when needed. No pcp listed on face sheet but pt states she sees Dr Mechele Claude w/ [facility].    Home Care active: no       Agency and services:    Readmission Risk Score: Risk of Admission or ED Visit: 2    1 - 19 Green   20 - 39 Yellow   > 40 Red      Case Management Services:    Complete  Comprehensive Assessment    no  (.cmcomp)        Chart reviewed and Case Management to follow and assist with discharge planning and inpatient management as needed.      Selinda Orion, RN  Ext [phone]  05/08/2018 @ 10:58

## 2018-05-08 NOTE — Allied Health Progress (Signed)
CASE MANAGEMENT DAILY NOTE    Spoke with Jan at [facility] [phone] and gave her updated report on the patient.  Spoke with patient who would prefer to stay here if possible.  [facility] notified    Patient was given a handout informing them of surrounding skilled nursing facility or home health care choices and their star ratings with contact information?  no    Chart reviewed and Case Management to follow and assist with discharge planning and inpatient management as needed.     Jobe Igo, Case Manager     Ext # [phone]  05/08/2018  13:08         CASE MANAGEMENT INITIAL PATIENT SCREEN    Patient: Diana Marquez                                    MRN: [id number]  Date of Birth: 1945/11/11 (54yr)                       Gender: female  PCP: Patient, No Pcp Per                             Date of Initial Screen:  05/08/2018 @ 10:58    Type of Contact: Face to Face    Date of Admission: Inpatient Admission Date/Time: 05/07/2018 4:31 PM      Recent ED or Admission Dates:            Insurance Status: Payor: [employer] / Plan: [employer]/SENIOR [employer]VANT[employer]/MEDICARE RISK / Product Type: *No Product type* /     Is the patient connected to the [facility] or have they ever been in the past?   no    Chief Complaint:  Abdominal Pain; Nausea; and Vomiting      Admitting Diagnosis:   Diabetic ketoacidosis without coma associated with type 1 diabetes mellitus (HCC) [E10.10]  Abscess of scalp [L02.811]  Generalized abdominal pain [R10.84]  Intractable vomiting with nausea, unspecified vomiting type [R11.2]  Dehydration [E86.0]  Acute kidney injury (HCC) [N17.9]     Consults:   GENERAL SURGERY CONSULT    Procedures:     Social Determinants: Pt lives with husband Diana Marquez and together they support one another.  There is a walker in the home and husband is currently receiving home health support for wound care to his foot.  Pt has daughter Diana Marquez who is helpful and available when needed. No pcp listed on face sheet but pt states she sees  Dr Mechele Claude w/ [facility].    Home Care active: no       Agency and services:    Readmission Risk Score: Risk of Admission or ED Visit: 2    1 - 19 Green   20 - 39 Yellow   > 40 Red      Case Management Services:    Complete Comprehensive Assessment    no  (.cmcomp)        Chart reviewed and Case Management to follow and assist with discharge planning and inpatient management as needed.      Selinda Orion, RN  Ext [phone]  05/08/2018 @ 10:58

## 2018-05-08 NOTE — Allied Health Progress (Signed)
CASE MANAGEMENT INITIAL PATIENT SCREEN    Patient: Diana Marquez                                    MRN: [id number]  Date of Birth: 05/19/1945 (8yr)                       Gender: female  PCP: Patient, No Pcp Per                             Date of Initial Screen:  05/08/2018 @ 10:58    Type of Contact: Face to Face    Date of Admission: Inpatient Admission Date/Time: 05/07/2018 4:31 PM      Recent ED or Admission Dates:            Insurance Status: Payor: [employer] / Plan: [employer]/S[employer]IOR [employer]/MEDICARE RISK / Product Type: *No Product type* /     Is the patient connected to the [facility] or have they ever been in the past?   no    Chief Complaint:  Abdominal Pain; Nausea; and Vomiting      Admitting Diagnosis:   Diabetic ketoacidosis without coma associated with type 1 diabetes mellitus (HCC) [E10.10]  Abscess of scalp [L02.811]  Generalized abdominal pain [R10.84]  Intractable vomiting with nausea, unspecified vomiting type [R11.2]  Dehydration [E86.0]  Acute kidney injury (HCC) [N17.9]     Consults:   GENERAL SURGERY CONSULT    Procedures:     Social Determinants: Pt lives with husband Merton Border and together they support one another.  There is a walker in the home and husband is currently receiving home health support for wound care to his foot.  Pt has daughter Legrand Como who is helpful and available when needed. No pcp listed on face sheet but pt states she sees Dr Mechele Claude w/ [facility].    Home Care active: no       Agency and services:    Readmission Risk Score: Risk of Admission or ED Visit: 2    1 - 19 Green   20 - 39 Yellow   > 40 Red      Case Management Services:    Complete Comprehensive Assessment    no  (.cmcomp)        Chart reviewed and Case Management to follow and assist with discharge planning and inpatient management as needed.      Selinda Orion, RN  Ext [phone]  05/08/2018 @ 10:58

## 2018-05-08 NOTE — Nurse Focus (Signed)
End of Shift Statement      Patient Progress per diagnosis: A/Ox4. VSS. Pain well controlled with norco and toradol. Vanco and zosyn continued. Awaiting sebaceous cyst excisional biopsy tomorrow. NPO after midnight.     Significant events:none    Patient is independent in repositioning.  The following items are in use for prevention/treatment minimal linens.    Patient's pain/comfort level assessed during shift and interventions for increased comfort were implemented as needed.    Non-pharmacological interventions: cold therapy applied.    Interventions were effective.    Patient was able to participate in ADL's  and rest comfortably  Side Effects: none  The patient was compliant with plan of care.  Education provided on medication zosyn and vanco.       Therapy Ambulation      Most Recent Value   Independence Level (Gait)  independent Filed at 05/08/2018 0700              Elmarie Shiley, RN

## 2018-05-08 NOTE — Nurse Focus (Signed)
End of Shift Statement      Patient Progress per diagnosis: received in bed 7 on cont tele monitor. Hx DM in DKA    Significant events:Insulin gtt weaned to off per DKA insulin protocol. tco2 at 19 gap 12  Patient's pain/comfort level assessed during shift and interventions for increased comfort were implemented as needed.    Non-pharmacological interventions: diversional activity, repositioning and pillow support.    Interventions were effective.  Patient was able to agree   Side Effects: none   Unless otherwise noted, the patient's status remained unchanged during shift. All planned interventions and routine care were completed during the shift, and the patient/family received teaching in the language preferred with sufficient comprehension.      Maceo Pro, RN

## 2018-05-08 NOTE — Consults (Signed)
CONSULTATION  PATIENT NAME:Diana Marquez, Diana Marquez      ADMIT DATE: 05/07/2018  DOB:[birth year]                          DISCHARGE DATE:  AGE:73                                  DATE OF VISIT: 05/08/2018  REFERRING PHYSICIAN:  ,      CHIEF COMPLAINT:    Nausea, vomiting, and posterior scalp pain.    HISTORY OF PRESENT ILLNESS:    Ms. Zwiebel is a 73 year old female who presents with a 2-week history of increasing pain over the right posterior scalp just above the hairline, she has never had any infection in the past.  She is a Neurosurgeon patient.  Went to her physician on 05/06/2018,   the physician lanced the lesion.  The patient states the pain worsened after the physician lanced the lesion.  The pain is persistent, sharp, worse with touching, severe.     The patient comes to the ER with nausea, vomiting, abdominal pain.  Workup showed diabetic ketoacidosis, pH 7.1, bicarb of 19, blood sugars of 582.    PAST MEDICAL HISTORY:    1. Diabetes mellitus type 1.  2. Hypertension.  3. Weakness of right arm.  4. Sleep apnea.  5. Chronic kidney disease.  6. Chronic back pain.  7. Bell's palsy.  8. Noncompliance.  9. Major depression.  10. GERD.    PAST SURGICAL HISTORY:    Hysterectomy.    FAMILY HISTORY:    Patient denies any family history of skin cancers or recurrent infection.    SOCIAL HISTORY:    Patient is a nonsmoker.  Does not drink alcohol.    ALLERGIES:    NO KNOWN DRUG ALLERGIES.     MEDICATIONS:    1. Aspirin.  2. Atenolol.  3. Lipitor.  4. Cymbalta.  5. Pepcid.  6. Norco.  7. Lantus.  8. Levothyroxine.  9. Bactrim.    REVIEW OF SYSTEMS:    Reviewed Dr. Doristine Church review of systems from 05/07/2018 and I agree with this.    PHYSICAL EXAMINATION:    VITAL SIGNS:  She is afebrile.  No apparent distress.  Comfortable, anxious.     HEENT:  Scalp shows a 4-5 cm posterior flat, hard, indurated infection consistent with infected sebaceous cyst.  It is mildly fluctuant, extremely tender, mildly warm, mildly erythematous.   There is no submandibular, no supraclavicular lymphadenopathy.    Sclerae is anicteric.  Oropharynx is clear.     CHEST:  Clear bilaterally.   CARDIAC:  No murmurs.     ABDOMEN:  Soft, nontender, nondistended.    IMAGING:    Her CT scan of the abdomen pelvis was personally reviewed by myself, which shows possible low-grade enteritis.  A chest x-ray yesterday was personally reviewed by myself which shows no pneumonia.    LAB DATA:    Her white count was 17.3, hemoglobin 9.2, blood sugar 257, creatinine is 0.85.    ASSESSMENT/PLAN:    1. Ms. Diab is a 73 year old female who presents with diabetic ketoacidosis and a very infected right posterior scalp cyst.  Recommend excisional biopsy under some anesthesia.  She understands all the risks of surgery which include bleeding, infection,   pain, injury to surrounding structures.  She understands it will need to heal by secondary intention,  which can take months to heal.  The patient understands she is to keep her glucose under control to help with wound healing.  She understands she needs   to have adequate protein intake to help with wound healing.  She understands it will need to be packed daily.  She states that her daughter, who lives with her, can   help with that.  2. Abdominal pain, resolved.  CT scan shows mild enteritis.  Continue to monitor.        Electronically Signed by Luiz Iron, MD on 05/14/2018 42:59:56  Luiz Iron, MD      DI: RRL  DD: 05/08/2018 11:10:51           JOB: [id number]  DT: 05/08/2018 13:29:44  DICT ID [id number]  MT: AM  NO PCP PER PATIENT    Luiz Iron, MD                PATIENT NAMEMarland Kitchen Diana Marquez, Diana Marquez [facility]                                      ZYS:[id number]                                      CSN-VISIT ID: [id number]                                      DATE: 05/08/2018                                      PATIENT ROOM #: [id number]                                      CONSULTATION                            Page  3 of 3

## 2018-05-08 NOTE — Allied Health Progress (Signed)
Case Management Assistant Update  05/08/2018  10:14    Clinical update faxed to:  [facility]  [facility]: [phone]  FX: [phone]          Clinton Sawyer, Case Manager Assistant     Ext # [phone]  05/08/2018

## 2018-05-08 NOTE — Nurse Transfer Note (Signed)
Orders received to transfer pt to med surg.  Pt and family updated with plan of care, all questions answered.  Nurse to nurse report called to receiving RN, Maralyn Sago.  All pt belongings gathered and brought with pt.  Pt placed in wheel chair and transported without incident.

## 2018-05-08 NOTE — Progress Notes (Signed)
HOSPITALIST MEDICINE DAILY PROGRESS NOTE    Patient Name: Diana Marquez DOB: 12/13/1945   PCP: Patient, No Pcp Per DOA: 05/07/2018  1:25 PM   Date of service: 05/08/2018 MRN: [id number]              Chief Complaint: Right-sided scalp abscess, DKA    SUBJECTIVE:   Area is crying she is holding her right side of the neck where she has an abscess.  Her DKA is resolved.  She is on insulin sliding scale.  Her abdominal pain nausea vomiting much better.  She has no chest pain or shortness of breath.  I have discussed the case with Dr. Liborio Nixon will be consulting.  In the meantime we can transfer out of the ICU.      Review of Systems    MEDICATIONS:    Docusate Calcium (COLACE) Capsule 240 mg, ORAL, QAM  Enoxaparin (LOVENOX) Injection 40 mg, SUBCUTANEOUS, Q24H  Insulin Glargine (LANTUS) 100 unit/mL Injection 10 Units, SUBCUTANEOUS, Q12H  Insulin Lispro (HUMALOG) Injection 0-15 Units, SUBCUTANEOUS, QID w/meals and bedtime  Lactobacillus (CULTURELLE) Sprinkle Capsule 1 Sprinkle Capsule, ORAL, BIDAC  Pantoprazole (PROTONIX) Injection 40 mg, IV, Q12H Now  Piperacillin/Tazobactam (ZOSYN) 3.375 gram in Dextrose 50 mL IVPB 4 HOUR Extended Infusion, IV, Q8H, Last Rate: Stopped (05/08/18 0851)  Saline Lock Flush 2.5 mL, IV, Q8H (64,33,29)  Vancomycin (VANCOCIN) 1,250 mg in NaCl (iso-osm) 250 mL IVPB, IV, Q24H Now  Vancomycin Pharmacy to Dose, NOT APPLICABLE, PATIENT FLAG ONLY      Insulin, 1-10 Units/hr, IV, CONTINUOUS, Last Rate: Stopped (05/08/18 0400)  NaCl 0.45%, , IV, CONTINUOUS, Last Rate: 125 mL (05/08/18 0246)      Acetaminophen (TYLENOL) Tablet 650 mg, ORAL, Q6H PRN  Alum-Mag Hydroxide-Simeth (MAALOX) 200-200-20 mg/5 mL Suspension 30 mL, ORAL, Q3H PRN  Bisacodyl (DULCOLAX) Suppository 10 mg, RECTALLY, Q24H PRN  Dextrose 50% Injection 25 g, IV, PRN  Dextrose 50% Injection 7.5-15 g, IV, PRN  Glucagon Injection 1 mg, IM, Q20MIN PRN  Hydrocodone 5 mg/Acetaminophen 325 mg (NORCO) Tablet 1 tablet, ORAL, Q4H PRN  Lidocaine  (XYLOCAINE) 10 mg/mL (1%) Injection 0.2 mL, INTRADERMAL, PRN  Magnesium Hydroxide (MILK OF MAGNESIA) 400 mg/5 mL Suspension 30 mL, ORAL, Q24H PRN  Magnesium Sulfate 1 gram in D5W 100 mL IVPB, IV, PRN, Last Rate: Stopped (05/07/18 2356)  Melatonin Tablet 3 mg, ORAL, Bedtime PRN  Naloxone (NARCAN) Injection 0.08 mg, IV, Q2MIN PRN  Naloxone (NARCAN) Injection 0.2 mg, IV, PRN  Ondansetron (ZOFRAN) Injection 4 mg, IV, Q8H PRN  Potassium Chloride 20 mEq/100 mL in Sterile Water IVPB 20 mEq, IV, PRN    Or  Potassium Chloride 10 mEq  / Lidocaine 10 mg in 0.9% NaCl IVPB, IV, PRN  Saline Lock Flush 2.5 mL, IV, PRN  Sodium Phosphate 15 mmol in NaCl 0.9% 250 mL IVPB, IV, PRN, Last Rate: Stopped (05/08/18 0458)  Sodium Phosphate 7.5 mmol in NaCl 0.9% 100 mL IVPB, IV, PRN  Sodium Phosphates 22.5 mmol in NaCl 0.9% 250 mL IVPB, IV, PRN          OBJECTIVE:   Vital Signs  Summary  Temp Min: 3.2 C (37.7 F) Max: 37.6 C (99.6 F)  BP: (88-196)/(45-99)   Pulse Min: 94 Max: 128  Resp Min: 11 Max: 36  SpO2 Min: 90 % Max: 100 %      Current Vitals  Temp: 37 C (98.6 F)  BP: 112/68  Pulse: 95  Resp: 26  SpO2: 97 %  Flow (L/min): 2   Weight: 72.7 kg (160 lb 4.4 oz)     Intake and Output  Last Two Completed Shifts  In: 1221 [Crystalloid:1221]  Out: 3 [Urine:3]        Physical Exam      General Appearance: healthy, alert, no distress, pleasant affect, cooperative.   HEENT: oral mucosa moist, No JVD, No cervical adenopathy, no nuchal rigidity, right posterior scalp abscess little more fluctuant and less redness today  Heart: normal rate and regular rhythm, no murmurs, clicks, or gallops.   Lungs: clear to auscultation.   Abdomen: BS normal.  Abdomen soft, non-tender.  No masses or organomegaly.   Extremities: bilateral lower extremity no edema   Skin: Skin color, texture, turgor normal. No rashes or lesions.    Neuro: No gross focal neurological deficit.   Mental Status: Appearance/Cooperation: oriented times  3.  Musculoskeletal: OA changes    LINES AND DRAINS: None    LAB TESTS/STUDIES:   I personally reviewed the following labs and     Recent labs for the past 72 hours     05/08/18 0521 05/08/18 0203 05/07/18 2204 05/07/18 1646 05/07/18 1339    GLUCOSE 189* 207* 281* 453* 729*    UREA NITROGEN, BLOOD (BUN) 16 17 20  23* 24*    CREATININE BLOOD 0.85 1.01 1.14 1.44* 1.88*    SODIUM 135 136 135 134 128*    POTASSIUM 3.7 3.8 4.1 4.3 5.2    CHLORIDE 107 110* 107 105 90*    CARBON DIOXIDE TOTAL 18* 19* 16* 7* &lt;5*    CALCIUM 7.9* 8.1* 8.0* 8.0* 9.8      Recent labs for the past 72 hours     05/07/18 2204 05/07/18 1339    WHITE BLOOD CELL COUNT 17.3* 24.6*    HEMOGLOBIN 9.2* 12.4    HEMATOCRIT 26.5* 36.5    PLATELET COUNT 257 393*        Lab Results   Component Value Date    BCULT No growth to date 05/07/2018            ASSESSMENT & PLAN:         Diabetic ketoacidosis without coma associated with type 1 diabetes mellitus (HCC) (05/07/2018)     Assessment: Resolved, nausea vomiting abdominal pain much better     Plan: Start diet, sliding scale will start Lantus at bedtime she does not know the dose, follow Accu-Cheks    Abscess of scalp (05/07/2018)     Assessment: Patient is on Vanco and Zosyn     Plan: Discussed case with Dr. Liborio Nixon will consult    Generalized abdominal pain (05/07/2018)     Assessment: Secondary to diabetic ketoacidosis     Plan: Significant improvement with treatment of the acidosis    Acute kidney injury (HCC) (05/07/2018)     Assessment: Resolved     Plan: Continue gentle hydration    Intractable vomiting with nausea, unspecified vomiting type (05/07/2018)     Assessment: Resolved     Plan: Continue IV Zofran    Dehydration (05/07/2018)     Assessment: Improved     Plan: Continue fluids and follow closely            DVT Prophylaxis -Lovenox    Disposition -MedSurg floor    Damarko Stitely Burney Gauze, MD,

## 2018-05-09 ENCOUNTER — Inpatient Hospital Stay: Payer: Medicare (Managed Care)

## 2018-05-09 ENCOUNTER — Inpatient Hospital Stay: Payer: Self-pay

## 2018-05-09 ENCOUNTER — Inpatient Hospital Stay
Admission: EM | Disposition: A | Discharge status: Home / Self Care | Payer: Self-pay | Source: Emergency Department | Attending: Internal Medicine

## 2018-05-09 DIAGNOSIS — L02811 Cutaneous abscess of head [any part, except face]: Principal | ICD-10-CM

## 2018-05-09 LAB — POC GLUCOSE
POC GLUCOSE: 136 mg/dL — AB (ref 70–99)
POC GLUCOSE: 168 mg/dL — AB (ref 70–99)
POC GLUCOSE: 310 mg/dL — AB (ref 70–99)
POC GLUCOSE: 406 mg/dL — AB (ref 70–99)

## 2018-05-09 LAB — BASIC METABOLIC PANEL
BUN/CREATININE_MMC: 15.8 (ref 7.3–21.7)
CALCIUM: 7.6 mg/dL — AB (ref 8.7–10.2)
CARBON DIOXIDE TOTAL: 22 mmol/L (ref 22–32)
CHLORIDE: 110 mmol/L — AB (ref 99–109)
CREATININE BLOOD: 0.76 mg/dL (ref 0.50–1.30)
E-GFR, NON-AFRICAN AMERICAN: 78 mL/min/{1.73_m2} (ref 60–?)
E-GFR_MMC: 90 mL/min/{1.73_m2} (ref 60–?)
GLUCOSE: 117 mg/dL — AB (ref 70–99)
POTASSIUM: 3.6 mmol/L (ref 3.5–5.2)
SODIUM: 134 mmol/L (ref 134–143)
UREA NITROGEN, BLOOD (BUN): 12 mg/dL (ref 6–21)

## 2018-05-09 LAB — VANCOMYCIN, TROUGH: VANCOMYCIN, TROUGH_MMC: 7.7 ug/mL — AB (ref 10.0–20.0)

## 2018-05-09 LAB — CULTURE URINE, BACTI

## 2018-05-09 SURGERY — EXCISION, LESION, SCALP
Anesthesia: Monitored Anesthesia Care | Site: Head | Laterality: Right | Wound class: Clean Contaminated

## 2018-05-09 MED ORDER — SODIUM CHLORIDE 0.9 % (FLUSH) INJECTION SYRINGE
2.50 mL | INJECTION | Freq: Three times a day (TID) | INTRAMUSCULAR | Status: DC
Start: 2018-05-09 — End: 2018-05-14
  Administered 2018-05-09 – 2018-05-12 (×6): 2.5 mL via INTRAVENOUS

## 2018-05-09 MED ORDER — DOCUSATE CALCIUM 240 MG CAPSULE
240.00 mg | ORAL_CAPSULE | Freq: Every day | ORAL | Status: DC
Start: 2018-05-09 — End: 2018-05-14
  Administered 2018-05-10 – 2018-05-14 (×4): 240 mg via ORAL
  Filled 2018-05-09: qty 1
  Filled 2018-05-09: qty 1, fill #0
  Filled 2018-05-09 (×3): qty 1

## 2018-05-09 MED ORDER — LIDOCAINE HCL 10 MG/ML (1 %) INJECTION SOLUTION
INTRAMUSCULAR | Status: DC | PRN
Start: 2018-05-09 — End: 2018-05-09
  Administered 2018-05-09: 20 mg via INTRAVENOUS

## 2018-05-09 MED ORDER — LIDOCAINE HCL 10 MG/ML (1 %) INJECTION SOLUTION
0.10 mL | INTRAMUSCULAR | Status: DC | PRN
Start: 2018-05-09 — End: 2018-05-09
  Filled 2018-05-09: qty 10, fill #0

## 2018-05-09 MED ORDER — HYDROMORPHONE 1 MG/ML INJECTION SYRINGE
0.50 mg | INJECTION | INTRAMUSCULAR | Status: DC | PRN
Start: 2018-05-09 — End: 2018-05-09

## 2018-05-09 MED ORDER — LACTATED RINGERS IV INFUSION
INTRAVENOUS | Status: DC
Start: 2018-05-09 — End: 2018-05-11
  Administered 2018-05-09 – 2018-05-10 (×3): via INTRAVENOUS
  Administered 2018-05-11: 1000 mL via INTRAVENOUS

## 2018-05-09 MED ORDER — ACETAMINOPHEN 325 MG TABLET
325.00 mg | ORAL_TABLET | ORAL | Status: DC | PRN
Start: 2018-05-09 — End: 2018-05-14
  Administered 2018-05-14: 325 mg via ORAL
  Filled 2018-05-09: qty 1

## 2018-05-09 MED ORDER — VANCOMYCIN 1.25 GRAM/250 ML IN 0.9 % SODIUM CHLORIDE INTRAVENOUS
1.25 g | Freq: Two times a day (BID) | INTRAVENOUS | Status: DC
Start: 2018-05-09 — End: 2018-05-09
  Filled 2018-05-09 (×2): qty 250, fill #0

## 2018-05-09 MED ORDER — INTRAOP NACL 0.9% 500 ML IRRIGATION BOTTLE
Status: DC | PRN
Start: 2018-05-09 — End: 2018-05-09
  Administered 2018-05-09: 500 mL

## 2018-05-09 MED ORDER — SODIUM CHLORIDE 0.9 % (FLUSH) INJECTION SYRINGE
2.50 mL | INJECTION | INTRAMUSCULAR | Status: DC | PRN
Start: 2018-05-09 — End: 2018-05-14

## 2018-05-09 MED ORDER — NALOXONE 0.4 MG/ML INJECTION SOLUTION
0.20 mg | INTRAMUSCULAR | Status: DC | PRN
Start: 2018-05-09 — End: 2018-05-14

## 2018-05-09 MED ORDER — KETOROLAC 30 MG/ML (1 ML) INJECTION SOLUTION
15.00 mg | Freq: Four times a day (QID) | INTRAMUSCULAR | Status: AC | PRN
Start: 2018-05-09 — End: 2018-05-13
  Administered 2018-05-09 – 2018-05-10 (×2): 15 mg via INTRAVENOUS
  Filled 2018-05-09 (×2): qty 1, fill #0

## 2018-05-09 MED ORDER — VANCOMYCIN 1.25 GRAM/250 ML IN 0.9 % SODIUM CHLORIDE INTRAVENOUS
1.25 g | Freq: Two times a day (BID) | INTRAVENOUS | Status: DC
Start: 2018-05-09 — End: 2018-05-11
  Administered 2018-05-09 – 2018-05-10 (×3): 1.25 g via INTRAVENOUS
  Filled 2018-05-09 (×4): qty 250, fill #0

## 2018-05-09 MED ORDER — HYDROCODONE 5 MG-ACETAMINOPHEN 325 MG TABLET
2.00 | ORAL_TABLET | ORAL | Status: DC | PRN
Start: 2018-05-09 — End: 2018-05-09
  Administered 2018-05-09: 2 via ORAL
  Filled 2018-05-09: qty 2, fill #0

## 2018-05-09 MED ORDER — HYDROMORPHONE 1 MG/ML INJECTION SYRINGE
0.50 mg | INJECTION | INTRAMUSCULAR | Status: DC | PRN
Start: 2018-05-09 — End: 2018-05-14
  Administered 2018-05-10 – 2018-05-13 (×7): 0.5 mg via INTRAVENOUS
  Filled 2018-05-09 (×2): qty 1
  Filled 2018-05-09: qty 1, fill #0
  Filled 2018-05-09 (×4): qty 1

## 2018-05-09 MED ORDER — NALOXONE 0.4 MG/ML INJECTION SOLUTION
0.08 mg | INTRAMUSCULAR | Status: DC | PRN
Start: 2018-05-09 — End: 2018-05-14

## 2018-05-09 MED ORDER — ONDANSETRON HCL (PF) 4 MG/2 ML INJECTION SOLUTION
4.00 mg | INTRAMUSCULAR | Status: DC | PRN
Start: 2018-05-09 — End: 2018-05-09

## 2018-05-09 MED ORDER — BISACODYL 10 MG RECTAL SUPPOSITORY
10.00 mg | RECTAL | Status: DC | PRN
Start: 2018-05-09 — End: 2018-05-14

## 2018-05-09 MED ORDER — FENTANYL (PF) 50 MCG/ML INJECTION SOLUTION
25.00 ug | INTRAMUSCULAR | Status: DC | PRN
Start: 2018-05-09 — End: 2018-05-09
  Administered 2018-05-09 (×4): 25 ug via INTRAVENOUS

## 2018-05-09 MED ORDER — VANCOMYCIN 1.25 GRAM/250 ML IN 0.9 % SODIUM CHLORIDE INTRAVENOUS
1.25 g | Freq: Two times a day (BID) | INTRAVENOUS | Status: DC
Start: 2018-05-10 — End: 2018-05-09
  Filled 2018-05-09: qty 250, fill #0

## 2018-05-09 MED ORDER — ACETAMINOPHEN 325 MG TABLET
325.00 mg | ORAL_TABLET | ORAL | Status: DC | PRN
Start: 2018-05-09 — End: 2018-05-09

## 2018-05-09 MED ORDER — MELATONIN 3 MG TABLET
3.00 mg | ORAL_TABLET | Freq: Every day | ORAL | Status: DC | PRN
Start: 2018-05-09 — End: 2018-05-14

## 2018-05-09 MED ORDER — ENOXAPARIN 40 MG/0.4 ML SUBCUTANEOUS SYRINGE
40.00 mg | INJECTION | SUBCUTANEOUS | Status: DC
Start: 2018-05-09 — End: 2018-05-14
  Administered 2018-05-09 – 2018-05-14 (×6): 40 mg via SUBCUTANEOUS
  Filled 2018-05-09 (×4): qty 0.4
  Filled 2018-05-09: qty 0.4, fill #0

## 2018-05-09 MED ORDER — INTRAOP BUPIVACAINE 0.25%-EPI 1:200,000 PF INJ 30 ML VIAL
Status: DC | PRN
Start: 2018-05-09 — End: 2018-05-09
  Administered 2018-05-09: 11:00:00
  Administered 2018-05-09: 18 mL

## 2018-05-09 MED ORDER — INTRAOP PROPOFOL INJ 100 ML VIAL (BOLUS+INFUSION)
Status: DC | PRN
Start: 2018-05-09 — End: 2018-05-09
  Administered 2018-05-09: 200 ug/kg/min via INTRAVENOUS

## 2018-05-09 MED ORDER — FENTANYL (PF) 50 MCG/ML INJECTION SOLUTION
INTRAMUSCULAR | Status: AC
Start: 2018-05-09 — End: 2018-05-09
  Filled 2018-05-09: qty 2, fill #0

## 2018-05-09 MED ORDER — MAGNESIUM HYDROXIDE 400 MG/5 ML ORAL SUSPENSION
30.00 mL | ORAL | Status: DC | PRN
Start: 2018-05-09 — End: 2018-05-14

## 2018-05-09 MED ORDER — HYDROCODONE 5 MG-ACETAMINOPHEN 325 MG TABLET
2.00 | ORAL_TABLET | ORAL | Status: DC | PRN
Start: 2018-05-09 — End: 2018-05-14
  Administered 2018-05-09 – 2018-05-14 (×14): 2 via ORAL
  Filled 2018-05-09 (×2): qty 2, fill #0
  Filled 2018-05-09 (×2): qty 2
  Filled 2018-05-09: qty 2, fill #0
  Filled 2018-05-09 (×7): qty 2
  Filled 2018-05-09: qty 1, fill #0
  Filled 2018-05-09: qty 2

## 2018-05-09 MED ORDER — LACTATED RINGERS IV INFUSION
INTRAVENOUS | Status: DC
Start: 2018-05-09 — End: 2018-05-09
  Administered 2018-05-09: 10:00:00 via INTRAVENOUS

## 2018-05-09 MED ORDER — LIDOCAINE 1 %-EPINEPHRINE 1:100,000 INJECTION SOLUTION
INTRAMUSCULAR | Status: DC | PRN
Start: 2018-05-09 — End: 2018-05-09
  Administered 2018-05-09: 10 mL
  Administered 2018-05-09: 7 mL

## 2018-05-09 MED ORDER — LACTOBACILLUS RHAMNOSUS GG 15 BILLION CELL SPRINKLE CAPSULE
1.00 | ORAL_CAPSULE | Freq: Two times a day (BID) | ORAL | Status: DC
Start: 2018-05-09 — End: 2018-05-14
  Administered 2018-05-09 – 2018-05-14 (×10): 1 via ORAL
  Filled 2018-05-09: qty 1
  Filled 2018-05-09: qty 1, fill #0
  Filled 2018-05-09 (×6): qty 1
  Filled 2018-05-09 (×2): qty 1, fill #0
  Filled 2018-05-09: qty 1

## 2018-05-09 MED ORDER — LACTATED RINGERS IV INFUSION
INTRAVENOUS | Status: DC
Start: 2018-05-09 — End: 2018-05-09

## 2018-05-09 MED ORDER — INTRAOP FENTANYL 50 MCG/ML INJ 2 ML AMP
Status: DC | PRN
Start: 2018-05-09 — End: 2018-05-09
  Administered 2018-05-09: 25 ug via INTRAVENOUS
  Administered 2018-05-09: 50 ug via INTRAVENOUS
  Administered 2018-05-09: 25 ug via INTRAVENOUS

## 2018-05-09 MED FILL — HYDROCODONE 5 MG-ACETAMINOPHEN 325 MG TABLET: 2.00 | 0 days supply | Qty: 1 | Fill #0

## 2018-05-09 MED FILL — KETOROLAC 30 MG/ML (1 ML) INJECTION SOLUTION: 15.00 mg | 0 days supply | Qty: 1 | Fill #0

## 2018-05-09 SURGICAL SUPPLY — 18 items
"DRESSING 6 X 6 3/4""  STERILE KERLIX 10/TR" (Dressing) ×1
APPLICATOR 26 ML CHLORAPREP ORANG STERL (Prep) ×1
BANDAGE 2084S COBAN 4 X 5 STERILE WRAP (Dressing) ×1
BANDAGE STERILE KERLIX ROLL 4X4(SUB) (Dressing) ×1
BLADE SURGICAL CLIPPER (Knife) ×1
CLOSURE C1544 STERI-STRIP BENZOIN TINCT (Dressing) ×1
DRESSING 4 X 4 16 PLY STRL GAUZE(SUB) (Dressing) ×1
GLOVE 2D72PT80X 8.0 PROTEXIS STERILE P/F (Glove) ×1
GOWN 9545 X-LARGE STERILE SURG LEVEL 3 (Gown) ×1
GOWN XXL STERILE (Gown) ×1
MARKER CL23357 SKIN MARKER (Other) ×1
PACK SBA69MPMMD MINOR PLUS 1 (Pack) ×1
SET SUT69MBMMD MAJOR BASIN (Basin) ×1
SLEEVE MED VP501MG CALF  SCD COMPRESS (Other) ×1
SPONGE 11005/50 1/2 " ROSEBUD/PEANUT (Sponge) ×1
SUTURE 0 VICRYL CT-2 VCP270H (Suture) ×1
SYRINGE GREEN EAR (Syringe) ×1
TOWEL ORT06BX 6 PK STRL BLU O.R. X-RAY (Drape) ×1

## 2018-05-09 NOTE — Nurse Focus (Signed)
End of Shift Statement      Patient Progress per diagnosis: Dr Liborio Nixon removed the cyst on posterior neck 1 inch iodoform packing in place ok to loosen and change kerlex and coban but leave packing in until Dsg change on Monday  Norco and Toradol effective for pain     Significant events:[facility] called Pt is non-compliant with DM  Need to teach pt and have her draw up Insulin and give self shots    Patient is independent in repositioning.  The following items are in use for prevention/treatment minimal linens.    Patient's pain/comfort level assessed during shift and interventions for increased comfort were implemented as needed.    Non-pharmacological interventions: diversional activity.    Interventions werewere effective.    Patient was able to rest comfortably  Side Effects: none  The patient was compliant with plan of care.  Education provided on medication Insulin.    RN Ambulation      Most Recent Value   Ambulation Distance (Feet)  20 Filed at 05/09/2018 0500         Therapy Ambulation      Most Recent Value   Independence Level (Gait)  independent Filed at 05/08/2018 0700              Lilia Pro, RN

## 2018-05-09 NOTE — Brief Op Note (Signed)
BRIEF OPERATIVE NOTE    Date of Service: 05/09/2018 11:29    Pre-Op Diagnosis:  Epidermal inclusion cyst posterior right scalp     Post-Op Diagnosis:  same    Procedure Performed/Description:  Incision and drainage right posterior scalp, Excisional biopsy of right posterior scalp epidermal inclusion cyst    Name of Surgeon & Assistants:  Jerald Kief, MD    Anesthesia:  1% Lidocaine with Epi, 0.25% Marcaine with Epi.    Findings:  4 cm sebaceous cyst, very infected, multiple pus pockets    EBL:  10 mL    Specimens Removed:  Epidermal inclusion cyst.    Indications: Veryle Dethloff is a 73yr old female who has an infected sebaceous cyst, presents for definitive management.  Patient she has pain and drainage. She understands the risks of the procedure which include but not limited to pain, recurrence, infection, recurrence, and bleeding.   She wishes to proceed.  She understands it will heal by secondary intention which can take approxiamtely 1-2 months to heal.    Procedure:  Patient was placed in the left lateral decubitus position.  A timeout was performed. The patient was prepped and draped in the standard fashion. 1% Lidocaine with Epinephrine and 0.25% Marcaine with Epi was injected subcutaneously in the area of concern.  A transverse incision 4.5 cm long was made down to the subcutaneous fat.  An elliptical incision was made around this incision, and the inferior and superior pieces of skin were sent as specimen.   The infection was severe, it infiltrated into the walls around the cyst, difficult to define the cavity.  Multiple pockets of purulence were entered, cultures were obtained.  The sebaceous sebum was removed, along with the wall of the sebaceous cyst using a Allis clamp.  The diameter of the lesion was approximately 5 cm wide.  It took extra time to get the lesion out and clean the cavity due to the increased bleeding and the severe infection, Figure of 8 2-0 Vicryl was used.  It was  irrigated copiously.  1 inch packing strip was placed into the cavity.  Gauze was placed over this.  The patient tolerated the procedure well and was sent home in stable condition.         Jerald Kief, MD FACS

## 2018-05-09 NOTE — Plan of Care (Signed)
Problem: Patient Care Overview  Goal: Plan of Care Review  Outcome: Ongoing (interventions implemented as appropriate)  Flowsheets  Taken 05/08/2018 1950  Plan of Care Reviewed With: patient  Taken 05/09/2018 0723  Progress: no change  Goal: Individualization and Mutuality  Outcome: Ongoing (interventions implemented as appropriate)  Goal: Discharge Needs Assessment  Outcome: Ongoing (interventions implemented as appropriate)  Goal: Interprofessional Rounds/Family Conf  Outcome: Ongoing (interventions implemented as appropriate)  Flowsheets (Taken 05/09/2018 0723)  Participants: nursing; patient     Problem: Skin Injury Risk (Adult)  Goal: Identify Related Risk Factors and Signs and Symptoms  Description  Related risk factors and signs and symptoms are identified upon initiation of Human Response Clinical Practice Guideline (CPG).  Outcome: Ongoing (interventions implemented as appropriate)  Goal: Skin Health and Integrity  Description  Patient will demonstrate the desired outcomes by discharge/transition of care.  Outcome: Ongoing (interventions implemented as appropriate)     Problem: Diabetes, Type 1 (Adult)  Goal: Signs and Symptoms of Listed Potential Problems Will be Absent, Minimized or Managed (Diabetes, Type 1)  Description  Signs and symptoms of listed potential problems will be absent, minimized or managed by discharge/transition of care (reference Diabetes, Type 1 (Adult) CPG).  Outcome: Ongoing (interventions implemented as appropriate)     Problem: Fall Risk (Adult)  Goal: Identify Related Risk Factors and Signs and Symptoms  Description  Related risk factors and signs and symptoms are identified upon initiation of Human Response Clinical Practice Guideline (CPG).  Outcome: Ongoing (interventions implemented as appropriate)  Goal: Absence of Fall  Description  Patient will demonstrate the desired outcomes by discharge/transition of care.  Outcome: Ongoing (interventions implemented as appropriate)      Problem: Pain, Acute (Adult)  Goal: Identify Related Risk Factors and Signs and Symptoms  Description  Related risk factors and signs and symptoms are identified upon initiation of Human Response Clinical Practice Guideline (CPG).  Outcome: Ongoing (interventions implemented as appropriate)  Goal: Acceptable Pain Control/Comfort Level  Description  Patient will demonstrate the desired outcomes by discharge/transition of care.  Outcome: Ongoing (interventions implemented as appropriate)     Problem: Infection, Risk/Actual (Adult)  Goal: Identify Related Risk Factors and Signs and Symptoms  Description  Related risk factors and signs and symptoms are identified upon initiation of Human Response Clinical Practice Guideline (CPG).  Outcome: Ongoing (interventions implemented as appropriate)  Goal: Infection Prevention/Resolution  Description  Patient will demonstrate the desired outcomes by discharge/transition of care.  Outcome: Ongoing (interventions implemented as appropriate)     Problem: Renal Failure/Kidney Injury, Acute (Adult)  Goal: Signs and Symptoms of Listed Potential Problems Will be Absent, Minimized or Managed (Renal Failure/Kidney Injury, Acute)  Description  Signs and symptoms of listed potential problems will be absent, minimized or managed by discharge/transition of care (reference Renal Failure/Kidney Injury, Acute (Adult) CPG).  Outcome: Ongoing (interventions implemented as appropriate)     Problem: Nausea/Vomiting (Adult)  Goal: Identify Related Risk Factors and Signs and Symptoms  Description  Related risk factors and signs and symptoms are identified upon initiation of Human Response Clinical Practice Guideline (CPG).  Outcome: Ongoing (interventions implemented as appropriate)  Goal: Symptom Relief  Description  Patient will demonstrate the desired outcomes by discharge/transition of care.  Outcome: Ongoing (interventions implemented as appropriate)  Goal: Adequate  Hydration  Description  Patient will demonstrate the desired outcomes by discharge/transition of care.  Outcome: Ongoing (interventions implemented as appropriate)

## 2018-05-09 NOTE — Plan of Care (Signed)
Problem: Patient Care Overview  Goal: Plan of Care Review  Flowsheets (Taken 05/09/2018 1854)  Plan of Care Reviewed With: patient  Progress: improving  Outcome Summary: see eoss     Problem: Skin Injury Risk (Adult)  Goal: Identify Related Risk Factors and Signs and Symptoms  Description  Related risk factors and signs and symptoms are identified upon initiation of Human Response Clinical Practice Guideline (CPG).  Flowsheets (Taken 05/09/2018 1854)  Related Risk Factors (Skin Injury Risk): critical care admission; infection  Goal: Skin Health and Integrity  Description  Patient will demonstrate the desired outcomes by discharge/transition of care.  Flowsheets (Taken 05/09/2018 1854)  Skin Health and Integrity: making progress toward outcome     Problem: Fall Risk (Adult)  Goal: Identify Related Risk Factors and Signs and Symptoms  Description  Related risk factors and signs and symptoms are identified upon initiation of Human Response Clinical Practice Guideline (CPG).  Flowsheets (Taken 05/09/2018 1854)  Related Risk Factors (Fall Risk): environment unfamiliar; objects hard to reach  Goal: Absence of Fall  Description  Patient will demonstrate the desired outcomes by discharge/transition of care.  Flowsheets (Taken 05/09/2018 1854)  Absence of Fall: making progress toward outcome     Problem: Pain, Acute (Adult)  Goal: Identify Related Risk Factors and Signs and Symptoms  Description  Related risk factors and signs and symptoms are identified upon initiation of Human Response Clinical Practice Guideline (CPG).  Flowsheets (Taken 05/09/2018 1854)  Related Risk Factors (Acute Pain): surgery  Goal: Acceptable Pain Control/Comfort Level  Description  Patient will demonstrate the desired outcomes by discharge/transition of care.  Flowsheets (Taken 05/09/2018 1854)  Acceptable Pain Control/Comfort Level: making progress toward outcome     Problem: Infection, Risk/Actual (Adult)  Goal: Identify Related Risk Factors and Signs and  Symptoms  Description  Related risk factors and signs and symptoms are identified upon initiation of Human Response Clinical Practice Guideline (CPG).  Flowsheets (Taken 05/09/2018 1854)  Related Risk Factors (Infection, Risk/Actual): chronic illness/condition  Goal: Infection Prevention/Resolution  Description  Patient will demonstrate the desired outcomes by discharge/transition of care.  Flowsheets (Taken 05/09/2018 1854)  Infection Prevention/Resolution: making progress toward outcome

## 2018-05-09 NOTE — Anesthesia Postprocedure Evaluation (Signed)
Patient: Diana Marquez    EXCISION, LESION, SCALP    Anesthesia Type: MAC    Stop Bang:      Vital Signs (Last Recorded):  BP: 126/64 (05/09/18 1200)  Pulse: 95 (05/09/18 1200)  Resp: 14 (05/09/18 1200)  Temp Max: 37.1 C (98.8 F)  (Last 24 hours)  Temp: 37 C (98.6 F) (05/09/18 1130)  SpO2: 97 % (05/09/18 1200) on    Device (Oxygen Therapy): room air (05/09/18 1150)      Anesthesia Post Evaluation    Procedure: Procedure(s):EXCISION, LESION, SCALP  Location: [facility] OR  Anesthesia: Monitored Anesthesia Care    Patient location during evaluation: PACU  Patient participation: complete - patient participated  Level of consciousness: awake and alert  Pain management: adequate  Airway patency: patent  Anesthetic complications: no  Cardiovascular status: blood pressure returned to baseline  Respiratory status: room air, spontaneous ventilation and nonlabored ventilation  Nausea and Vomiting: absent     Sharyne Peach, DO          Sharyne Peach, DO

## 2018-05-09 NOTE — Nurse Transfer Note (Signed)
Pt transferred from pacu back to room 115 post I&D scalp cyst. No complications in recovery. Vital signs stable on room air. Head dressing CDI and secured. Moderate pain controlled with titrated doses of fentanyl for a total of 100 mcg throughout 1 hr of recovery period. Denies nausea. Awake and oriented. Following commands. Blood glucose 168 post op.

## 2018-05-09 NOTE — Nurse Focus (Signed)
End of Shift Statement      Patient Progress per diagnosis: Vital signs stable, afebrile, blood sugars 274 and 137, pain well controlled with Norco and Toradol, up to toilet with SBA and FWW, slept between care.       Significant events: Consent signed, surgery with Dr. Liborio Nixon scheduled for 1000.     Patient is independent in repositioning.  The following items are in use for prevention/treatment minimal linens.    Patient's pain/comfort level assessed during shift and interventions for increased comfort were implemented as needed.    Non-pharmacological interventions: pillow support.    Interventions were effective.    Patient was able to participate in ADL's , rest comfortably and increase mobility  Side Effects: none  The patient was compliant with plan of care.  Education provided on medication Toradol.    RN Ambulation      Most Recent Value   Ambulation Distance (Feet)  20 Filed at 05/09/2018 0500         Therapy Ambulation      Most Recent Value   Independence Level (Gait)  independent Filed at 05/08/2018 0700            Leeanne Rio, RN

## 2018-05-09 NOTE — Allied Health Progress (Addendum)
CASE MANAGEMENT DAILY NOTE      Pt had an ID of scalp abscess- infected sebaceous cyst,  stated likely reason for DKA., stable postoperatively.  CM continuing to follow,likely no needs, Independent with assistance of spouse.  [facility] was involved in this case however pt has opted to stay at [facility] no further communication with the transfer center.    Patient was given a handout informing them of surrounding skilled nursing facility or home health care choices and their star ratings with contact information?  yes    Chart reviewed and Case Management to follow and assist with discharge planning and inpatient management as needed.     Caryl Asp, RN     Ext # [phone]    CASE MANAGEMENT DAILY NOTE    Received call from Jan at [facility] [phone]. She states that they will not need to transfer patient and she remain here for her care at this time. Patient notified.     Chart reviewed and Case Management to follow and assist with discharge planning and inpatient management as needed.     Jobe Igo, Case Manager     Ext # [phone]  05/08/2018  14:27          CASE MANAGEMENT DAILY NOTE    Spoke with Jan at [facility] [phone] and gave her updated report on the patient.  Spoke with patient who would prefer to stay here if possible.  [facility] notified    Patient was given a handout informing them of surrounding skilled nursing facility or home health care choices and their star ratings with contact information?  no    Chart reviewed and Case Management to follow and assist with discharge planning and inpatient management as needed.     Jobe Igo, Case Manager     Ext # [phone]  05/08/2018  13:08         CASE MANAGEMENT INITIAL PATIENT SCREEN    Patient: Diana Marquez                                    MRN: [id number]  Date of Birth: 02-05-1946 (89yr)                       Gender: female  PCP: Patient, No Pcp Per                             Date of Initial Screen:  05/08/2018 @ 10:58    Type of Contact: Face to Face    Date of  Admission: Inpatient Admission Date/Time: 05/07/2018 4:31 PM      Recent ED or Admission Dates:            Insurance Status: Payor: [employer] / Plan: [facility]/MEDICARE RISK / Product Type: *No Product type* /     [facility] or have they ever been in the past?   no    Chief Complaint:  Abdominal Pain; Nausea; and Vomiting      Admitting Diagnosis:   Diabetic ketoacidosis without coma associated with type 1 diabetes mellitus (HCC) [E10.10]  Abscess of scalp [L02.811]  Generalized abdominal pain [R10.84]  Intractable vomiting with nausea, unspecified vomiting type [R11.2]  Dehydration [E86.0]  Acute kidney injury (HCC) [N17.9]     Consults:   GENERAL SURGERY CONSULT  Procedures:     Social Determinants: Pt lives with husband Merton Border and together they support one another.  There is a walker in the home and husband is currently receiving home health support for wound care to his foot.  Pt has daughter Legrand Como who is helpful and available when needed. No pcp listed on face sheet but pt states she sees Dr Mechele Claude w/ [facility].    Home Care active: no       Agency and services:    Readmission Risk Score: Risk of Admission or ED Visit: 2    1 - 19 Green   20 - 39 Yellow   > 40 Red      Case Management Services:    Complete Comprehensive Assessment    no  (.cmcomp)        Chart reviewed and Case Management to follow and assist with discharge planning and inpatient management as needed.      Selinda Orion, RN  Ext [phone]  05/08/2018 @ 10:58  05/09/2018  13:32

## 2018-05-09 NOTE — Progress Notes (Addendum)
HOSPITALIST MEDICINE DAILY PROGRESS NOTE  Patient Name: Diana Marquez DOB: 05/18/1945   PCP: Patient, No Pcp Per DOA: 05/07/2018  1:25 PM   Date: 05/09/2018  MRN: [id number]     SUBJECTIVE: Complains of some pain in the scalp but otherwise denies any chills, shivering, nausea or vomiting.  Also denies abdominal pain.    ASSESSMENT & PLAN:       Diabetic ketoacidosis without coma associated with type 1 diabetes mellitus (HCC) (05/07/2018)  Resolved no symptoms of DKA anymore  No DKA symptoms anymore.    Abscess of scalp (05/07/2018)  Status post I&D by Dr.Lussenden on 05/09/2018  Pending Gram stain and culture sensitive report of the wound/abscess.  Continue IV Zosyn and IV vancomycin for now    Generalized abdominal pain (05/07/2018)  Likely from DKA but presently resolved    Acute kidney injury (HCC) (05/07/2018)  Secondary to DKA back to baseline after fluid resuscitation    Dehydration (05/07/2018)  Mild hyponatremia but otherwise dehydration resolved.    DVT Prophylaxis: Enoxaparin SC    Disposition:  -1-2 days if cleared by surgery.    Review of Systems  As mentioned above otherwise more than 14 ROS unremarkable.    MEDICATIONS:  Scheduled Medications  Atorvastatin (LIPITOR) Tablet 40 mg, ORAL, Daily Bedtime  Docusate Calcium (COLACE) Capsule 240 mg, ORAL, QAM  Duloxetine (CYMBALTA) Delayed Release Capsule 60 mg, ORAL, QAM  Enoxaparin (LOVENOX) Injection 40 mg, SUBCUTANEOUS, Q24H  FamoTIDine (PEPCID) Tablet 20 mg, ORAL, BID  Insulin Glargine (LANTUS) 100 unit/mL Injection 20 Units, SUBCUTANEOUS, Daily Bedtime  Insulin Lispro (HUMALOG) Injection 0-15 Units, SUBCUTANEOUS, QID w/meals and bedtime  Lactobacillus (CULTURELLE) Sprinkle Capsule 1 Sprinkle Capsule, ORAL, BIDAC  LevoTHYROxine Tablet 100 mcg, ORAL, QAM AC  Pantoprazole (PROTONIX) Injection 40 mg, IV, Q12H Now  Piperacillin/Tazobactam (ZOSYN) 3.375 gram in Dextrose 50 mL IVPB 4 HOUR Extended Infusion, IV, Q8H, Last Rate: 3.375 g (05/09/18 1742)  Saline Lock Flush 2.5  mL, IV, Q8H (61,44,31)  Vancomycin (VANCOCIN) 1,250 mg in NaCl (iso-osm) 250 mL IVPB, IV, Q12H Now, Last Rate: Stopped (05/09/18 1725)  Vancomycin Pharmacy to Dose, NOT APPLICABLE, PATIENT FLAG ONLY      IV Medications  Lactated Ringers, , IV, CONTINUOUS, Last Rate: 75 mL/hr at 05/09/18 1440  NaCl 0.45%, , IV, CONTINUOUS, Last Rate: 1,000 mL (05/09/18 0631)      PRN Medications  Acetaminophen (TYLENOL) Tablet 325 mg, ORAL, Q4H PRN  Acetaminophen (TYLENOL) Tablet 650 mg, ORAL, Q6H PRN  Alum-Mag Hydroxide-Simeth (MAALOX) 200-200-20 mg/5 mL Suspension 30 mL, ORAL, Q3H PRN  Bisacodyl (DULCOLAX) Suppository 10 mg, RECTALLY, Q24H PRN  Dextrose 50% Injection 25 g, IV, PRN  Glucagon Injection 1 mg, IM, Q20MIN PRN  Hydrocodone 5 mg/Acetaminophen 325 mg (NORCO) Tablet 2 tablet, ORAL, Q4H PRN  Hydromorphone (DILAUDID) Injection 0.5 mg, IV, Q2H PRN  Ketorolac (TORADOL) Injection 15 mg, IV, Q6H PRN  Lidocaine (XYLOCAINE) 10 mg/mL (1%) Injection 0.2 mL, INTRADERMAL, PRN  Magnesium Hydroxide (MILK OF MAGNESIA) 400 mg/5 mL Suspension 30 mL, ORAL, Q24H PRN  Magnesium Sulfate 1 gram in D5W 100 mL IVPB, IV, PRN, Last Rate: Stopped (05/07/18 2356)  Melatonin Tablet 3 mg, ORAL, Bedtime PRN  Naloxone (NARCAN) Injection 0.08 mg, IV, Q2MIN PRN  Naloxone (NARCAN) Injection 0.2 mg, IV, PRN  Ondansetron (ZOFRAN) Injection 4 mg, IV, Q8H PRN  Potassium Chloride 20 mEq/100 mL in Sterile Water IVPB 20 mEq, IV, PRN    Or  Potassium Chloride 10 mEq  / Lidocaine 10  mg in 0.9% NaCl 100mL IVPB, IV, PRN  Saline Lock Flush 2.5 mL, IV, PRN  Sodium Phosphate 15 mmol in NaCl 0.9% 250 mL IVPB, IV, PRN, Last Rate: Stopped (05/08/18 0458)  Sodium Phosphate 7.5 mmol in NaCl 0.9% 100 mL IVPB, IV, PRN  Sodium Phosphates 22.5 mmol in NaCl 0.9% 250 mL IVPB, IV, PRN        OBJECTIVE:   Vital Signs    Current Vitals  Temp: 36.8 C (98.2 F)  BP: 130/79  Pulse: 97  Resp: 20  SpO2: 99 %  Flow (L/min): 0   Weight: 72.7 kg (160 lb 4.4 oz)     Intake and  Output  Last Two Completed Shifts  In: 4534 [Oral:1250; Crystalloid:3284]  Out: 401 [Urine:401]    Current Shift  In: 590 [Oral:90; Crystalloid:500]  Out: 300 [Urine:300]    Physical Exam  Constitutional:       Appearance: She is well-developed.   HENT:      Head: Normocephalic and atraumatic.      Comments: Post surgical dressing on the scalp with seems to be dry and no soakage visible.  Eyes:      Pupils: Pupils are equal, round, and reactive to light.   Neck:      Musculoskeletal: Neck supple.   Cardiovascular:      Rate and Rhythm: Normal rate and regular rhythm.      Heart sounds: No murmur.   Pulmonary:      Effort: No respiratory distress.      Breath sounds: No wheezing or rales.   Abdominal:      General: Bowel sounds are normal.      Palpations: Abdomen is soft.      Tenderness: There is no abdominal tenderness. There is no rebound.   Musculoskeletal: Normal range of motion.   Skin:     General: Skin is warm.      Findings: No rash.   Neurological:      Mental Status: She is alert and oriented to person, place, and time.      Coordination: Coordination normal.       IMAGING  [facility] CT ABDOMEN PELVIS W/O CONTRAST   Final Result   IMPRESSION:    1.  No acute intra-abdominal or pelvic abnormality identified. Normal appendix.   2.  Moderate loose enteric contents within the cecum and ascending colon may be secondary to low-grade enteritis. No evidence of bowel obstruction.   3.  Mild distal colonic diverticulosis without evidence of acute diverticulitis.       Radiation dose report:   CTDIvol 13.18 mGy   DLP 676.9 mGy*cm      [facility] are performed using dose optimization techniques as appropriate to a performed exam including the following: automated exposure control, adjustment of the mA and/or kV according to patient size, and/or use of iterative reconstruction technique.         [facility]   Final Result   IMPRESSION:   No acute  cardiopulmonary process is identified.                   LAB TESTS/STUDIES:  Lab Results - 24 hours (excluding micro and POC)   POC GLUCOSE     Status: Abnormal   Result Value Status    POC GLUCOSE 276 (Abnl) Final    POC GLUCOSE MACHINE ID      POC GLUCOSE STRIP  LOT#     BASIC METABOLIC PANEL     Status: Abnormal   Result Value Status    Sodium 134 Final    Potassium 3.6 Final    Chloride 110 (H) Final    Carbon Dioxide Total 22 Final    Urea Nitrogen, Blood (BUN) 12 Final    Creatinine Serum 0.76 Final    BUN/ Creatinine 15.8 Final    Glucose 117 (H) Final    Calcium 7.6 (L) Final    E-GFR 90 Final    E-GFR, Non-African American 78 Final   POC GLUCOSE     Status: Abnormal   Result Value Status    POC GLUCOSE 136 (Abnl) Final    POC GLUCOSE MACHINE ID      POC GLUCOSE STRIP LOT#     [facility] CULTURE, AEROBIC     Status: None (Preliminary result)   Result Value Status    Gram Stain Few Polymorphonuclear cells Preliminary    Gram Stain Few Gram positive cocci Preliminary   [facility] CULTURE, AEROBIC     Status: None (Preliminary result)   Result Value Status    Gram Stain Few Polymorphonuclear cells Preliminary    Gram Stain Few Gram positive cocci Preliminary   POC GLUCOSE     Status: Abnormal   Result Value Status    POC GLUCOSE 168 (Abnl) Final    POC GLUCOSE MACHINE ID      POC GLUCOSE STRIP LOT#     VANCOMYCIN, TROUGH     Status: Abnormal   Result Value Status    Vancomycin, Trough 7.7 (L) Final   POC GLUCOSE     Status: Abnormal   Result Value Status    POC GLUCOSE 310 (Abnl) Final    POC GLUCOSE MACHINE ID      POC GLUCOSE STRIP LOT#       CBC   Recent labs for the past 72 hours     05/07/18 2204 05/07/18 1339    WHITE BLOOD CELL COUNT 17.3* 24.6*    HEMOGLOBIN 9.2* 12.4    HEMATOCRIT 26.5* 36.5    PLATELET COUNT 257 393*      CHEM 20   Recent labs for the past 72 hours     05/09/18 0129 05/08/18 1606 05/08/18 0521 05/08/18 0203 05/07/18 2204 05/07/18 1646 05/07/18 1339    SODIUM 134 132* 135 136 135 134 128*    POTASSIUM 3.6 3.7  3.7 3.8 4.1 4.3 5.2    CHLORIDE 110* 103 107 110* 107 105 90*    CARBON DIOXIDE TOTAL 22 18* 18* 19* 16* 7* &lt;5*    CREATININE BLOOD 0.76 0.98 0.85 1.01 1.14 1.44* 1.88*    UREA NITROGEN, BLOOD (BUN) 12 16 16 17 20  23* 24*    GLUCOSE 117* 243* 189* 207* 281* 453* 729*    CALCIUM 7.6* 8.0* 7.9* 8.1* 8.0* 8.0* 9.8    PROTEIN -- -- -- -- 6.3 -- 8.3*    ALBUMIN -- -- -- -- 3.2 -- 4.1    ALKALINE PHOSPHATASE (ALP) -- -- -- -- 81 -- 121    ASPARTATE TRANSAMINASE (AST) -- -- -- -- 10 -- 21    BILIRUBIN TOTAL -- -- -- -- 1.3 -- 1.9    ALANINE TRANSFERASE (ALT) -- -- -- -- 12 -- 16    TRIGLYCERIDE -- -- -- -- 149 -- --    URIC ACID, BLOOD -- -- -- -- -- -- --    LACTATE DEHYDROGENASE -- -- -- -- -- -- --  PHOSPHORUS (PO4) -- -- -- -- 1.5* -- 5.9*    CHOLESTEROL -- -- -- -- -- -- --    MAGNESIUM (MG) -- -- -- -- 1.7* 1.9 2.1     @PT    Recent labs for the past 48 hours     05/08/18 1253    PROTHROMBIN TIME 10.6    APTT 23.5*            Rica Koyanagi, MD  Hospitalist / Internal Medicine

## 2018-05-10 ENCOUNTER — Encounter: Payer: Self-pay | Admitting: Emergency Medicine

## 2018-05-10 LAB — CBC WITH DIFFERENTIAL
Basophils % Auto: 0.6 % (ref 0.0–1.0)
Basophils Abs Auto: 0 10*3/uL (ref 0.0–0.1)
Eosinophils % Auto: 4.5 % — ABNORMAL HIGH (ref 0.0–4.0)
Eosinophils Abs Auto: 0.2 10*3/uL (ref 0.0–0.2)
Hematocrit: 25.6 % — ABNORMAL LOW (ref 36.0–48.0)
Hemoglobin: 8.9 g/dL — ABNORMAL LOW (ref 12.0–16.0)
Immature Granulocytes % Auto: 0.9 % — ABNORMAL HIGH (ref 0.00–0.50)
Immature Granulocytes Abs Auto: 0.1 10*3/uL — ABNORMAL HIGH (ref 0.0–0.0)
Lymphocytes % Auto: 32.9 % (ref 5.0–41.0)
Lymphocytes Abs Auto: 1.8 10*3/uL (ref 1.3–2.9)
MCH: 32.2 pg (ref 27.0–34.0)
MCHC g/dL: 34.8 g/dL (ref 33.0–37.0)
MCV: 92.8 fL (ref 82.0–97.0)
MPV: 8.7 fL — ABNORMAL LOW (ref 9.4–12.4)
Monocytes % Auto: 7.6 % (ref 0.0–10.0)
Monocytes Abs Auto: 0.4 10*3/uL (ref 0.3–0.8)
Neutrophils % Auto: 53.5 % (ref 45.0–75.0)
Neutrophils Abs Auto: 2.88 10*3/uL (ref 2.20–4.80)
Nucleated Cell Count: 0 10*3/uL (ref 0.0–0.1)
Nucleated RBC/100 WBC: 0 % WBC (ref <=0.0)
Platelet Count: 226 10*3/uL (ref 151–365)
RDW: 12.9 % (ref 11.5–14.5)
Red Blood Cell Count: 2.76 10*6/uL — ABNORMAL LOW (ref 3.80–5.10)
White Blood Cell Count: 5.4 10*3/uL (ref 4.2–10.8)

## 2018-05-10 LAB — BASIC METABOLIC PANEL
BUN/CREATININE_MMC: 11.4 (ref 7.3–21.7)
CALCIUM: 7.7 mg/dL — AB (ref 8.7–10.2)
CARBON DIOXIDE TOTAL: 22 mmol/L (ref 22–32)
CHLORIDE: 103 mmol/L (ref 99–109)
CREATININE BLOOD: 0.79 mg/dL (ref 0.50–1.30)
E-GFR, NON-AFRICAN AMERICAN: 74 mL/min/{1.73_m2} (ref 60–?)
E-GFR_MMC: 86 mL/min/{1.73_m2} (ref 60–?)
GLUCOSE: 301 mg/dL — AB (ref 70–99)
POTASSIUM: 3.5 mmol/L (ref 3.5–5.2)
SODIUM: 132 mmol/L — AB (ref 134–143)
UREA NITROGEN, BLOOD (BUN): 9 mg/dL (ref 6–21)

## 2018-05-10 LAB — POC GLUCOSE
POC GLUCOSE: 242 mg/dL — AB (ref 70–99)
POC GLUCOSE: 151 mg/dL — AB (ref 70–99)
POC GLUCOSE: 266 mg/dL — AB (ref 70–99)
POC GLUCOSE: 179 mg/dL — AB (ref 70–99)

## 2018-05-10 MED ORDER — INSULIN GLARGINE 100 UNIT/ML SUB-Q VIAL
27.00 [IU] | Freq: Every day | SUBCUTANEOUS | Status: DC
Start: 2018-05-10 — End: 2018-05-14
  Administered 2018-05-10 – 2018-05-13 (×4): 27 [IU] via SUBCUTANEOUS

## 2018-05-10 NOTE — Progress Notes (Signed)
GENERAL SURGERY PROGRESS NOTE    Note Date and Time: 05/10/2018 11:58 Date of Admission: 05/07/2018  1:25 PM    Diana Marquez  MRN: [id number]  Patient's PCP: Patient, No Pcp Per      Summary:   Diana Marquez is a 73yr old female who is s/p Procedure(s) with comments:  EXCISION, LESION, SCALP (Right) - excisional biopsy of right posterior scalp infected epidermal inclusion cyst performed 05/09/2018    24hr Interval Events:   - Patient rates pain at mild.  Much improved.  - Patient denies nausea/vomiting.    - No subjective fevers, chills, or sweats.    - Tolerating a diet.         Vital Signs   Current Vitals  Temp: 36.1 C (97 F) (05/10/18 0722)  BP: 146/84 (05/10/18 0722) Pulse: 89 (05/10/18 0722)  Resp: 18 (05/10/18 0722)  SpO2: 94 % (05/10/18 0722)      Weight: 72.7 kg (160 lb 4.4 oz) (05/07/18 1845)  24 hour Summary:   Temp Min: 36.1 C (96.9 F) Max: 37.9 C (100.3 F)  BP: (116-164)/(58-91)   Pulse Min: 88 Max: 106  Resp Min: 14 Max: 20  SpO2 Min: 94 % Max: 99 %  No Data Recorded         I/O:    Intake/Output Summary (Last 24 hours) at 05/10/2018 1158  Last data filed at 05/10/2018 0600  Gross per 24 hour   Intake 630 ml   Output 2050 ml   Net -1420 ml       Physical Exam:  Gen:  NAD, Alert and orientated x 3  Abd:  Soft, nontender, mild distention  Wound: Incision: dressings are clean and intact.   No erythema, fluctuance, or drainage.      Labs:    Recent labs for the past 48 hours     05/10/18 0430 05/09/18 0129    SODIUM 132* 134    POTASSIUM 3.5 3.6    CHLORIDE 103 110*    CARBON DIOXIDE TOTAL 22 22    UREA NITROGEN, BLOOD (BUN) 9 12    CREATININE BLOOD 0.79 0.76    GLUCOSE 301* 117*      Recent labs for the past 48 hours     05/10/18 0430    WHITE BLOOD CELL COUNT 5.4    HEMOGLOBIN 8.9*    HEMATOCRIT 25.6*    PLATELET COUNT 226      Recent labs for the past 48 hours     05/08/18 1253    INR 0.91        No results found for this basename: TP:*,ALB:*,TBIL:*,ALP:*,AST:*,ALT:*,BILID:* in the  last 48 hours    24hr Imaging:   [facility] Ct Abdomen Pelvis W/o Contrast    Result Date: 05/07/2018  [facility] CT ABDOMEN PELVIS WITHOUT CONTRAST INDICATION: 73 year old female with vomiting, abdominal pain. Leukocytosis. COMPARISON: None. TECHNIQUE: Unenhanced CT images of the abdomen and pelvis were obtained and reconstructed in multiple planes. Some images are degraded by motion. FINDINGS: LOWER THORAX:  No pleural or pericardial effusions are identified. The visualized bilateral lung bases appear clear. Small fat-containing eventrations are identified at the posterior right hemidiaphragm as seen on axial series 3 image 13. CT ABDOMEN: LIVER AND SPLEEN:  No discrete hepatic or splenic abnormality is detected. GALLBLADDER, COMMON BILE DUCT AND PANCREAS:  The gallbladder is within normal limits.  The common bile duct is normal in diameter.  The pancreas is grossly unremarkable accounting for motion artifact. ADRENALS  AND KIDNEYS:  The left adrenal is unremarkable.  The right adrenal is unremarkable.  No discrete renal mass lesion, hydronephrosis or nephrolithiasis is seen. AORTA AND RETROPERITONEUM:  Mild scattered atherosclerotic calcifications are identified at the abdominal aorta without aneurysmal dilatation. There is mild flattening of the IVC.  No retroperitoneal mass or adenopathy. STOMACH AND SMALL BOWEL:  The stomach contains a small amount of fluid and gas, otherwise unremarkable.  Unopacified small bowel loops normal in caliber. CT PELVIS: REPRODUCTIVE ORGANS:  The uterus is not definitely appreciated. No discrete adnexal abnormality is identified. URETERS AND URINARY BLADDER:  The urinary bladder within normal limits.  The right ureter is unremarkable.  The left ureter is unremarkable. ADENOPATHY/MASSES:  No pelvic sidewall, obturator nor inguinal adenopathy. RECTUM AND COLON:  There is mild distal colonic diverticulosis without evidence of acute diverticulitis. Small to moderate stool is noted within the colon.  Moderate loose enteric contents are identified within the cecum and ascending colon on coronal image 15. TERMINAL ILEUM AND APPENDIX:  The terminal ileum is within normal limits.  There appears to be a short appendix on coronal image 18, which is otherwise unremarkable.Marland Kitchen VENTRAL WALL:  No umbilical, ventral wall nor inguinal hernias. FREE FLUID/AIR:  No free fluid, no free air. BONES:  No acute osseous abnormality is seen. Prominent posterior facet arthropathy is identified at the lumbar spine. 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 There is levoconvex curvature of the lumbar spine. Severe discogenic degenerative changes are identified at L1-L2 and L2-L3. There is nonacute-appearing loss of height at the inferior endplate of vertebral body L1 best appreciated on sagittal image 36. There is grade 1 anterolisthesis of L4 on L5, probably degenerative.     IMPRESSION: 1.  No acute intra-abdominal or pelvic abnormality identified. Normal appendix. 2.  Moderate loose enteric contents within the cecum and ascending colon may be secondary to low-grade enteritis. No evidence of bowel obstruction. 3.  Mild distal colonic diverticulosis without evidence of acute diverticulitis. Radiation dose report: CTDIvol 13.18 mGy DLP 676.9 mGy*cm All CT scans at [facility] are performed using dose optimization techniques as appropriate to a performed exam including the following: automated exposure control, adjustment of the mA and/or kV according to patient size, and/or use of iterative reconstruction technique.     [facility] Chest 2 Views    Result Date: 05/07/2018  DATE of Exam:  05/07/2018 14:15 INDICATION: Female, age 73 years.  Respiratory distress. COMPARISON: None TECHNIQUE:  2 views of the chest were obtained. FINDINGS:  The lungs are clear. The cardiomediastinal silhouette is within normal limits. No acute osseous abnormality is seen.     IMPRESSION: No acute cardiopulmonary process  is identified.       A/P: Diana Marquez is a 73yr old female with severely infected scalp abscess s/p Procedure(s) with comments:  EXCISION, LESION, SCALP (Right) - excisional biopsy of right posterior scalp infected epidermal inclusion cyst performed 05/09/2018    - very infected: continue IV antibiotics, await cultures.  - dressing changes daily, with iodoform packing strips or plain packing strips.    - teach dressing changes to family, or set up home health.  - set up outpatient wound care appt.    Jerald Kief, MD FACS

## 2018-05-10 NOTE — Nurse Focus (Signed)
Contacted Dr. Virgilio Belling regarding pt blood glucose 405, no new orders received.

## 2018-05-10 NOTE — Nurse Focus (Signed)
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  The following items are in use for prevention/treatment minimal linens.    Patient's pain/comfort level assessed during shift and interventions for increased comfort were implemented as needed.    Non-pharmacological interventions: diversional activity.    Interventions were effective.    Patient was able to rest comfortably  Side Effects: none  The patient was compliant with plan of care.  Education provided on medication Dilaudid.    RN Ambulation      Most Recent Value   Ambulation Distance (Feet)  120 Filed at 05/10/2018 1401         Therapy Ambulation      Most Recent Value   Independence Level (Gait)  independent Filed at 05/08/2018 0700              Lilia Pro, RN

## 2018-05-10 NOTE — Plan of Care (Signed)
Problem: Patient Care Overview  Goal: Plan of Care Review  Flowsheets  Taken 05/09/2018 1950 by Leeanne Rio, RN  Plan of Care Reviewed With: patient  Taken 05/09/2018 1854 by Lilia Pro, RN  Progress: improving  Outcome Summary: see eoss  Goal: Interprofessional Rounds/Family Conf  Flowsheets (Taken 05/09/2018 [phone])  Participants: nursing;patient     Problem: Skin Injury Risk (Adult)  Goal: Identify Related Risk Factors and Signs and Symptoms  Description  Related risk factors and signs and symptoms are identified upon initiation of Human Response Clinical Practice Guideline (CPG).  Flowsheets (Taken 05/10/2018 [phone])  Related Risk Factors (Skin Injury Risk): infection; tissue perfusion altered  Goal: Skin Health and Integrity  Description  Patient will demonstrate the desired outcomes by discharge/transition of care.  Flowsheets (Taken 05/10/2018 [phone])  Skin Health and Integrity: making progress toward outcome  Intervention: Promote/Optimize Nutrition  Flowsheets (Taken 05/10/2018 0658)  Oral Nutrition Promotion: rest periods promoted  Intervention: Prevent/Manage Excess Moisture  Flowsheets  Taken 05/10/2018 0530 by Laurier Nancy, Technician  Bathing/Skin Care: bath chin to toes with CHG on all exposed skin  Taken 05/10/2018 0658 by Leeanne Rio, RN  Skin Protection: adhesive use limited;tubing/devices free from skin contact  Taken 05/09/2018 2100 by Thereasa Distance, Technician  Perineal Care: perineum cleansed;absorbent pad changed  Intervention: Maintain Head of Bed Elevation Less Than 30 Degrees as Tolerated  Flowsheets (Taken 05/09/2018 2100 by Thereasa Distance, Technician)  Head of Bed [facility]): [facility] elevated  Intervention: Prevent/Minimize Shear/Friction Injuries  Flowsheets  Taken 05/10/2018 0658 by Leeanne Rio, RN  Pressure Reduction Devices: pressure-redistributing mattress utilized  Taken 05/09/2018 2100 by Thereasa Distance, Technician  Positioning/Transfer Devices: pillows;in use  Intervention: Prevent  or Minimize Pressure  Flowsheets  Taken 05/10/2018 0658 by Leeanne Rio, RN  Pressure Reduction Techniques: frequent weight shift encouraged  Taken 05/09/2018 2100 by Thereasa Distance, Technician  Body Position: side-lying, left     Problem: Diabetes, Type 1 (Adult)  Goal: Signs and Symptoms of Listed Potential Problems Will be Absent, Minimized or Managed (Diabetes, Type 1)  Description  Signs and symptoms of listed potential problems will be absent, minimized or managed by discharge/transition of care (reference Diabetes, Type 1 (Adult) CPG).  Flowsheets (Taken 05/10/2018 [phone])  Problems Assessed (Type 1 Diabetes): all  Problems Present (Type 1 Diabetes): hyperglycemia  Intervention: Support/Optimize Psychosocial Response to Condition  Flowsheets  Taken 05/10/[phone]  Supportive Measures: active listening utilized;positive reinforcement provided;self-care encouraged  Taken 05/09/2018 1950  Family/Support System Care: self-care encouraged  Intervention: Monitor/Manage Signs of Ketoacidosis  Flowsheets (Taken 05/10/2018 0658)  Fluid/Electrolyte Management: fluids provided  Intervention: Optimize Glycemic Control  Flowsheets (Taken 05/10/2018 0658)  Glycemic Management: blood glucose monitoring; oral hydration promoted; other (see comments) (sliding scale insulin)     Problem: Fall Risk (Adult)  Goal: Identify Related Risk Factors and Signs and Symptoms  Description  Related risk factors and signs and symptoms are identified upon initiation of Human Response Clinical Practice Guideline (CPG).  Flowsheets (Taken 05/10/2018 [phone])  Related Risk Factors (Fall Risk): fatigue/slow reaction; environment unfamiliar  Signs and Symptoms (Fall Risk): presence of risk factors  Goal: Absence of Fall  Description  Patient will demonstrate the desired outcomes by discharge/transition of care.  Flowsheets (Taken 05/10/2018 [phone])  Absence of Fall: making progress toward outcome  Intervention: Monitor/Assist with Self Care  Flowsheets  Taken  05/10/2018 0658 by Leeanne Rio, RN  Self-Care Promotion: independence encouraged;BADL personal objects within  reach  Taken 05/09/2018 2100 by Thereasa DistanceVillarino, Anthony M, Technician  Activity Assistance Provided: assistance, stand-by  Intervention: Reduce Risk/Promote Restraint Free Environment  Flowsheets  Taken 05/10/2018 [phone] by Leeanne RioNeeley, Shamiracle Gorden A, RN  Safety Promotion/Fall Prevention: fall prevention program maintained;nonskid shoes/slippers when out of bed;safety round/check completed;activity supervised  Environmental Safety Modification: lighting adjusted;clutter free environment maintained;assistive device/personal items within reach;room organization consistent  Taken 05/09/2018 2100 by Thereasa DistanceVillarino, Anthony M, Technician  Safety/Security Measures: bed alarm set  Intervention: Review Medications/Identify Contributors to Fall Risk  Flowsheets (Taken 05/09/2018 1950)  Medication Review/Management: medications reviewed     Problem: Pain, Acute (Adult)  Goal: Identify Related Risk Factors and Signs and Symptoms  Description  Related risk factors and signs and symptoms are identified upon initiation of Human Response Clinical Practice Guideline (CPG).  Flowsheets  Taken 05/09/2018 1854 by Lilia ProMyers, Deborah A, RN  Related Risk Factors (Acute Pain): surgery  Taken 05/10/2018 [phone] by Leeanne RioNeeley, Deasiah Hagberg A, RN  Signs and Symptoms (Acute Pain): fatigue/weakness;verbalization of pain descriptors  Goal: Acceptable Pain Control/Comfort Level  Description  Patient will demonstrate the desired outcomes by discharge/transition of care.  Flowsheets (Taken 05/10/2018 [phone])  Acceptable Pain Control/Comfort Level: making progress toward outcome  Intervention: Monitor and Manage Analgesia  Flowsheets  Taken 05/10/2018 [phone]  Bowel Intervention: adequate fluid intake promoted;ambulation promoted  Taken 05/09/2018 1950  Medication Review/Management: medications reviewed  Intervention: Mutually Develop/Implement Acute Pain Management Plan  Flowsheets  Taken 05/10/2018  [phone]  Sensory Stimulation Regulation: care clustered  Taken 05/09/2018 2009  Pain Management Interventions: care clustered;position adjusted;relaxation techniques promoted;quiet environment facilitated  Intervention: Support/Optimize Psychosocial Response to Acute Pain  Flowsheets  Taken 05/10/2018 [phone]  Supportive Measures: active listening utilized;positive reinforcement provided;self-care encouraged  Taken 05/09/2018 1950  Diversional Activities: television  Family/Support System Care: self-care encouraged  Trust Relationship/Rapport: care explained;choices provided;questions answered;reassurance provided;thoughts/feelings acknowledged     Problem: Infection, Risk/Actual (Adult)  Goal: Identify Related Risk Factors and Signs and Symptoms  Description  Related risk factors and signs and symptoms are identified upon initiation of Human Response Clinical Practice Guideline (CPG).  Flowsheets (Taken 05/10/2018 [phone])  Related Risk Factors (Infection, Risk/Actual): surgery/procedure; skin integrity impairment; chronic illness/condition  Signs and Symptoms (Infection, Risk/Actual): blood glucose changes; pain  Goal: Infection Prevention/Resolution  Description  Patient will demonstrate the desired outcomes by discharge/transition of care.  Flowsheets (Taken 05/10/2018 [phone])  Infection Prevention/Resolution: making progress toward outcome  Intervention: Manage Suspected/Actual Infection  Flowsheets  Taken 05/09/2018 2009  Fever Reduction/Comfort Measures: medication administered  Taken 05/10/2018 [phone]  Infection Management: aseptic technique maintained  Intervention: Prevent Infection/Maximize Resistance  Flowsheets  Taken 05/10/2018 [phone] by Leeanne RioNeeley, Onelia Cadmus A, RN  Oral Nutrition Promotion: rest periods promoted  Sleep/Rest Enhancement: awakenings minimized  Glycemic Management: blood glucose monitoring;oral hydration promoted;other (see comments) (sliding scale insulin)  Taken 05/10/2018 0530 by Laurier NancyHarlow, Richard W, Technician  Bathing/Skin  Care: bath chin to toes with CHG on all exposed skin     Problem: Renal Failure/Kidney Injury, Acute (Adult)  Goal: Signs and Symptoms of Listed Potential Problems Will be Absent, Minimized or Managed (Renal Failure/Kidney Injury, Acute)  Description  Signs and symptoms of listed potential problems will be absent, minimized or managed by discharge/transition of care (reference Renal Failure/Kidney Injury, Acute (Adult) CPG).  Flowsheets (Taken 05/10/2018 [phone])  Problems Assessed (Acute Renal Failure/Kidney Injury): all  Problems Present (ARF/Kidney Injury): none  Intervention: Prevent/Manage Infection  Flowsheets  Taken 05/09/2018 2100 by Thereasa DistanceVillarino, Anthony M, Technician  Infection Prevention: rest/sleep promoted  Oral Care: oral care per protocol  Taken 05/09/2018 2009 by Leeanne Rio, RN  Fever Reduction/Comfort Measures: medication administered  Taken 05/10/2018 8938 by Leeanne Rio, RN  Infection Management: aseptic technique maintained  Intervention: Monitor/Manage Fluid, Acid Base Balance  Flowsheets  Taken 05/09/2018 2100 by Thereasa Distance, Technician  Body Position: side-lying, left  Taken 05/09/2018 1950 by Leeanne Rio, RN  Medication Review/Management: medications reviewed  Taken 05/10/2018 0658 by Leeanne Rio, RN  Fluid/Electrolyte Management: fluids provided  Intervention: Evaluate/Maintain Nutrition Support  Flowsheets (Taken 05/10/2018 [phone])  Environmental Support: calm environment promoted  Intervention: Monitor and Manage Signs of Bleeding  Flowsheets (Taken 05/10/2018 0658)  Bleeding Precautions: monitored for signs of bleeding  Bleeding Management: dressing monitored  Intervention: Promote Energy Conservation  Flowsheets (Taken 05/10/2018 0658)  Sleep/Rest Enhancement: awakenings minimized  Intervention: Provide Oxygenation/Ventilation/Perfusion Support  Flowsheets  Taken 05/10/2018 0658 by Leeanne Rio, RN  Activity Management: activity adjusted per tolerance  Taken 05/09/2018 2100 by Thereasa Distance,  Technician  Head of Bed [facility]): [facility] elevated     Problem: Nausea/Vomiting (Adult)  Goal: Identify Related Risk Factors and Signs and Symptoms  Description  Related risk factors and signs and symptoms are identified upon initiation of Human Response Clinical Practice Guideline (CPG).  Flowsheets (Taken 05/10/2018 [phone])  Signs and Symptoms (Nausea/Vomiting): weakness  Goal: Symptom Relief  Description  Patient will demonstrate the desired outcomes by discharge/transition of care.  Flowsheets (Taken 05/10/2018 [phone])  Symptom Relief: making progress toward outcome  Goal: Adequate Hydration  Description  Patient will demonstrate the desired outcomes by discharge/transition of care.  Flowsheets (Taken 05/10/2018 [phone])  Adequate Hydration: making progress toward outcome  Intervention: Minimize Nausea Triggers/Manage Symptoms  Flowsheets (Taken 05/10/2018 0658)  Environmental Support: calm environment promoted  Intervention: Position to Prevent Aspiration  Flowsheets (Taken 05/09/2018 2100 by Thereasa Distance, Technician)  Body Position: side-lying, left  Head of Bed [facility]): HOB elevated  Intervention: Promote/Maintain Hydration  Flowsheets (Taken 05/10/2018 0658)  Fluid/Electrolyte Management: fluids provided

## 2018-05-10 NOTE — Nurse Focus (Signed)
End of Shift Statement      Patient Progress per diagnosis: Vital signs stable, afebrile, blood sugars 405 (MD notified) and 242, pain well controlled with Norco and Toradol, dressing to posterior scalp changed, moderate drainage noted, packing remains intact, up to toilet with SBA and FWW, slept between care.       Significant events: None    Patient is independent in repositioning.  The following items are in use for prevention/treatment minimal linens.    Patient's pain/comfort level assessed during shift and interventions for increased comfort were implemented as needed.    Non-pharmacological interventions: pillow support.    Interventions were effective.    Patient was able to participate in ADL's  and rest comfortably  Side Effects: none  The patient was compliant with plan of care.  Education provided on diagnosis Dibetes and high blood sugar education provided.    RN Ambulation      Most Recent Value   Ambulation Distance (Feet)  20 Filed at 05/09/2018 2009         Therapy Ambulation      Most Recent Value   Independence Level (Gait)  independent Filed at 05/08/2018 0700              Leeanne Rio, RN

## 2018-05-10 NOTE — Plan of Care (Signed)
Problem: Patient Care Overview  Goal: Plan of Care Review  Flowsheets (Taken 05/10/2018 1419)  Plan of Care Reviewed With: patient  Progress: improving  Outcome Summary: see eoss     Problem: Skin Injury Risk (Adult)  Goal: Identify Related Risk Factors and Signs and Symptoms  Description  Related risk factors and signs and symptoms are identified upon initiation of Human Response Clinical Practice Guideline (CPG).  Flowsheets (Taken 05/10/2018 7654 by Leeanne Rio, RN)  Related Risk Factors (Skin Injury Risk): infection;tissue perfusion altered  Goal: Skin Health and Integrity  Description  Patient will demonstrate the desired outcomes by discharge/transition of care.  Flowsheets (Taken 05/10/2018 1419)  Skin Health and Integrity: making progress toward outcome     Problem: Diabetes, Type 1 (Adult)  Goal: Signs and Symptoms of Listed Potential Problems Will be Absent, Minimized or Managed (Diabetes, Type 1)  Description  Signs and symptoms of listed potential problems will be absent, minimized or managed by discharge/transition of care (reference Diabetes, Type 1 (Adult) CPG).  Flowsheets (Taken 05/10/2018 6503 by Leeanne Rio, RN)  Problems Assessed (Type 1 Diabetes): all     Problem: Fall Risk (Adult)  Goal: Identify Related Risk Factors and Signs and Symptoms  Description  Related risk factors and signs and symptoms are identified upon initiation of Human Response Clinical Practice Guideline (CPG).  Flowsheets (Taken 05/10/2018 5465 by Leeanne Rio, RN)  Related Risk Factors (Fall Risk): fatigue/slow reaction;environment unfamiliar  Signs and Symptoms (Fall Risk): presence of risk factors  Goal: Absence of Fall  Description  Patient will demonstrate the desired outcomes by discharge/transition of care.  Flowsheets (Taken 05/10/2018 1419)  Absence of Fall: making progress toward outcome     Problem: Pain, Acute (Adult)  Goal: Identify Related Risk Factors and Signs and Symptoms  Description  Related risk factors and  signs and symptoms are identified upon initiation of Human Response Clinical Practice Guideline (CPG).  Flowsheets  Taken 05/09/2018 1854 by Lilia Pro, RN  Related Risk Factors (Acute Pain): surgery  Taken 05/10/2018 6812 by Leeanne Rio, RN  Signs and Symptoms (Acute Pain): fatigue/weakness;verbalization of pain descriptors  Goal: Acceptable Pain Control/Comfort Level  Description  Patient will demonstrate the desired outcomes by discharge/transition of care.  Flowsheets (Taken 05/10/2018 1419)  Acceptable Pain Control/Comfort Level: making progress toward outcome     Problem: Infection, Risk/Actual (Adult)  Goal: Identify Related Risk Factors and Signs and Symptoms  Description  Related risk factors and signs and symptoms are identified upon initiation of Human Response Clinical Practice Guideline (CPG).  Flowsheets (Taken 05/10/2018 7517 by Leeanne Rio, RN)  Related Risk Factors (Infection, Risk/Actual): surgery/procedure;skin integrity impairment;chronic illness/condition  Signs and Symptoms (Infection, Risk/Actual): blood glucose changes;pain  Goal: Infection Prevention/Resolution  Description  Patient will demonstrate the desired outcomes by discharge/transition of care.  Flowsheets (Taken 05/10/2018 1419)  Infection Prevention/Resolution: making progress toward outcome     Problem: Renal Failure/Kidney Injury, Acute (Adult)  Goal: Signs and Symptoms of Listed Potential Problems Will be Absent, Minimized or Managed (Renal Failure/Kidney Injury, Acute)  Description  Signs and symptoms of listed potential problems will be absent, minimized or managed by discharge/transition of care (reference Renal Failure/Kidney Injury, Acute (Adult) CPG).  Flowsheets (Taken 05/10/2018 0017 by Leeanne Rio, RN)  Problems Assessed (Acute Renal Failure/Kidney Injury): all

## 2018-05-10 NOTE — Progress Notes (Signed)
HOSPITALIST MEDICINE DAILY PROGRESS NOTE  Patient Name: Diana Marquez DOB: 09/10/1945   PCP: Patient, No Pcp Per DOA: 05/07/2018  1:25 PM   Date: 05/10/2018  MRN: [id number]     SUBJECTIVE: Less pain in the scalp but otherwise denies any chills, shivering, nausea or vomiting. Denies abdominal pain, fever, urinary disturbance or bowel disturbance.    ASSESSMENT & PLAN:       Diabetic ketoacidosis without coma associated with type 1 diabetes mellitus (HCC) (05/07/2018)  DKA resolved no symptoms of DKA anymore  Uncontrolled hyperglycemia will increase glargine insulin 27Units nightly.    Abscess of scalp (05/07/2018)  Status post I&D by Dr.Lussenden on 05/09/2018  Pending Gram stain and culture sensitive report of the wound/abscess.  Continue IV Zosyn and IV vancomycin for now    Generalized abdominal pain (05/07/2018)  Likely from DKA but presently resolved    Acute kidney injury (HCC) (05/07/2018)  Secondary to DKA back to baseline after fluid resuscitation    Dehydration (05/07/2018)  Mild hyponatremia but otherwise dehydration resolved.    DVT Prophylaxis: Enoxaparin SC    Disposition:  -1-2 days if cleared by surgery.    Review of Systems  As mentioned above otherwise more than 14 ROS unremarkable.    MEDICATIONS:  Scheduled Medications  Atorvastatin (LIPITOR) Tablet 40 mg, ORAL, Daily Bedtime  Docusate Calcium (COLACE) Capsule 240 mg, ORAL, QAM  Duloxetine (CYMBALTA) Delayed Release Capsule 60 mg, ORAL, QAM  Enoxaparin (LOVENOX) Injection 40 mg, SUBCUTANEOUS, Q24H  FamoTIDine (PEPCID) Tablet 20 mg, ORAL, BID  Insulin Glargine (LANTUS) 100 unit/mL Injection 27 Units, SUBCUTANEOUS, Daily Bedtime  Insulin Lispro (HUMALOG) Injection 0-15 Units, SUBCUTANEOUS, QID w/meals and bedtime  Lactobacillus (CULTURELLE) Sprinkle Capsule 1 Sprinkle Capsule, ORAL, BIDAC  LevoTHYROxine Tablet 100 mcg, ORAL, QAM AC  Pantoprazole (PROTONIX) Injection 40 mg, IV, Q12H Now  Piperacillin/Tazobactam (ZOSYN) 3.375 gram in Dextrose 50 mL IVPB 4 HOUR  Extended Infusion, IV, Q8H, Last Rate: Stopped (05/10/18 1507)  Saline Lock Flush 2.5 mL, IV, Q8H (84,69,62(06,14,22)  Vancomycin (VANCOCIN) 1,250 mg in NaCl (iso-osm) 250 mL IVPB, IV, Q12H Now, Last Rate: Stopped (05/10/18 0615)  Vancomycin Pharmacy to Dose, NOT APPLICABLE, PATIENT FLAG ONLY      IV Medications  Lactated Ringers, , IV, CONTINUOUS, Last Rate: 75 mL/hr at 05/10/18 0349  NaCl 0.45%, , IV, CONTINUOUS, Last Rate: 1,000 mL (05/09/18 0631)      PRN Medications  Acetaminophen (TYLENOL) Tablet 325 mg, ORAL, Q4H PRN  Acetaminophen (TYLENOL) Tablet 650 mg, ORAL, Q6H PRN  Alum-Mag Hydroxide-Simeth (MAALOX) 200-200-20 mg/5 mL Suspension 30 mL, ORAL, Q3H PRN  Bisacodyl (DULCOLAX) Suppository 10 mg, RECTALLY, Q24H PRN  Dextrose 50% Injection 25 g, IV, PRN  Glucagon Injection 1 mg, IM, Q20MIN PRN  Hydrocodone 5 mg/Acetaminophen 325 mg (NORCO) Tablet 2 tablet, ORAL, Q4H PRN  Hydromorphone (DILAUDID) Injection 0.5 mg, IV, Q2H PRN  Ketorolac (TORADOL) Injection 15 mg, IV, Q6H PRN  Lidocaine (XYLOCAINE) 10 mg/mL (1%) Injection 0.2 mL, INTRADERMAL, PRN  Magnesium Hydroxide (MILK OF MAGNESIA) 400 mg/5 mL Suspension 30 mL, ORAL, Q24H PRN  Magnesium Sulfate 1 gram in D5W 100 mL IVPB, IV, PRN, Last Rate: Stopped (05/07/18 2356)  Melatonin Tablet 3 mg, ORAL, Bedtime PRN  Naloxone (NARCAN) Injection 0.08 mg, IV, Q2MIN PRN  Naloxone (NARCAN) Injection 0.2 mg, IV, PRN  Ondansetron (ZOFRAN) Injection 4 mg, IV, Q8H PRN  Potassium Chloride 20 mEq/100 mL in Sterile Water IVPB 20 mEq, IV, PRN    Or  Potassium Chloride  10 mEq  / Lidocaine 10 mg in 0.9% NaCl IVPB, IV, PRN  Saline Lock Flush 2.5 mL, IV, PRN  Sodium Phosphate 15 mmol in NaCl 0.9% 250 mL IVPB, IV, PRN, Last Rate: Stopped (05/08/18 0458)  Sodium Phosphate 7.5 mmol in NaCl 0.9% 100 mL IVPB, IV, PRN  Sodium Phosphates 22.5 mmol in NaCl 0.9% 250 mL IVPB, IV, PRN        OBJECTIVE:   Vital Signs    Current Vitals  Temp: 36.1 C (97 F)  BP: 146/84  Pulse: 89  Resp: 18  SpO2:  94 %  Flow (L/min): 0   Weight: 72.7 kg (160 lb 4.4 oz)     Intake and Output  Last Two Completed Shifts  In: 1130 [Oral:630; Crystalloid:500]  Out: 2050 [Urine:2050]    Current Shift  No intake/output data recorded.    Physical Exam  Constitutional:       Appearance: She is well-developed.   HENT:      Head: Normocephalic and atraumatic.      Comments: Post surgical dressing on the scalp with seems to be dry and no soakage visible.  Eyes:      Pupils: Pupils are equal, round, and reactive to light.   Neck:      Musculoskeletal: Neck supple.   Cardiovascular:      Rate and Rhythm: Normal rate and regular rhythm.      Heart sounds: No murmur.   Pulmonary:      Effort: No respiratory distress.      Breath sounds: No wheezing or rales.   Abdominal:      General: Bowel sounds are normal.      Palpations: Abdomen is soft.      Tenderness: There is no abdominal tenderness. There is no rebound.   Musculoskeletal: Normal range of motion.   Skin:     General: Skin is warm.      Findings: No rash.   Neurological:      Mental Status: She is alert and oriented to person, place, and time.      Coordination: Coordination normal.       IMAGING  [facility] CT ABDOMEN PELVIS W/O CONTRAST   Final Result   IMPRESSION:    1.  No acute intra-abdominal or pelvic abnormality identified. Normal appendix.   2.  Moderate loose enteric contents within the cecum and ascending colon may be secondary to low-grade enteritis. No evidence of bowel obstruction.   3.  Mild distal colonic diverticulosis without evidence of acute diverticulitis.       Radiation dose report:   CTDIvol 13.18 mGy   DLP 676.9 mGy*cm      All CT scans at [facility] and [facility] are performed using dose optimization techniques as appropriate to a performed exam including the following: automated exposure control, adjustment of the mA and/or kV according to patient size, and/or use of iterative reconstruction technique.         [facility] CHEST 2  VIEWS   Final Result   IMPRESSION:   No acute cardiopulmonary process is identified.                   LAB TESTS/STUDIES:  Lab Results - 24 hours (excluding micro and POC)   VANCOMYCIN, TROUGH     Status: Abnormal   Result Value Status    Vancomycin, Trough 7.7 (L) Final   POC GLUCOSE     Status: Abnormal   Result  Value Status    POC GLUCOSE 310 (Abnl) Final    POC GLUCOSE MACHINE ID      POC GLUCOSE STRIP LOT#     POC GLUCOSE     Status: Abnormal   Result Value Status    POC GLUCOSE 406 (Abnl) Final    POC GLUCOSE MACHINE ID      POC GLUCOSE STRIP LOT#     BASIC METABOLIC PANEL     Status: Abnormal   Result Value Status    Sodium 132 (L) Final    Potassium 3.5 Final    Chloride 103 Final    Carbon Dioxide Total 22 Final    Urea Nitrogen, Blood (BUN) 9 Final    Creatinine Serum 0.79 Final    BUN/ Creatinine 11.4 Final    Glucose 301 (H) Final    Calcium 7.7 (L) Final    E-GFR 86 Final    E-GFR, Non-African American 74 Final   CBC WITH DIFFERENTIAL     Status: Abnormal   Result Value Status    White Blood Cell Count 5.4 Final    Red Blood Cell Count 2.76 (L) Final    Hemoglobin 8.9 (L) Final    Hematocrit 25.6 (L) Final    MCV 92.8 Final    MCH 32.2 Final    MCHC g/dL 02.5 Final    RDW 42.7 Final    MPV 8.7 (L) Final    Platelet Count 226 Final    Neutrophils % Auto 53.5 Final    Lymphocytes % Auto 32.9 Final    Monocytes % Auto 7.6 Final    Eosinophils % Auto 4.5 (H) Final    Basophils % Auto 0.6 Final    Immature Granulocytes % Auto 0.90 (H) Final    Neutrophils Abs Auto 2.88 Final    Lymphocytes Abs Auto 1.8 Final    Monocytes Abs Auto 0.4 Final    Eosinophils Abs Auto 0.2 Final    Basophils Abs Auto 0.0 Final    Nucleated RBC/100 WBC 0.0 Final    Nucleated Cell Count 0.0 Final    Immature Granulocytes Abs Auto 0.1 (H) Final   POC GLUCOSE     Status: Abnormal   Result Value Status    POC GLUCOSE 242 (Abnl) Final    POC GLUCOSE MACHINE ID      POC GLUCOSE STRIP LOT#     POC GLUCOSE     Status: Abnormal   Result  Value Status    POC GLUCOSE 151 (Abnl) Final    POC GLUCOSE MACHINE ID      POC GLUCOSE STRIP LOT#       CBC   Recent labs for the past 72 hours     05/10/18 0430 05/07/18 2204 05/07/18 1339    WHITE BLOOD CELL COUNT 5.4 17.3* 24.6*    HEMOGLOBIN 8.9* 9.2* 12.4    HEMATOCRIT 25.6* 26.5* 36.5    PLATELET COUNT 226 257 393*      CHEM 20   Recent labs for the past 72 hours     05/10/18 0430 05/09/18 0129 05/08/18 1606 05/08/18 0521 05/08/18 0203 05/07/18 2204 05/07/18 1646 05/07/18 1339    SODIUM 132* 134 132* 135 136 135 134 128*    POTASSIUM 3.5 3.6 3.7 3.7 3.8 4.1 4.3 5.2    CHLORIDE 103 110* 103 107 110* 107 105 90*    CARBON DIOXIDE TOTAL 22 22 18* 18* 19* 16* 7* <5*    CREATININE BLOOD 0.79 0.76  0.98 0.85 1.01 1.14 1.44* 1.88*    UREA NITROGEN, BLOOD (BUN) 9 12 16 16 17 20  23* 24*    GLUCOSE 301* 117* 243* 189* 207* 281* 453* 729*    CALCIUM 7.7* 7.6* 8.0* 7.9* 8.1* 8.0* 8.0* 9.8    PROTEIN -- -- -- -- -- 6.3 -- 8.3*    ALBUMIN -- -- -- -- -- 3.2 -- 4.1    ALKALINE PHOSPHATASE (ALP) -- -- -- -- -- 81 -- 121    ASPARTATE TRANSAMINASE (AST) -- -- -- -- -- 10 -- 21    BILIRUBIN TOTAL -- -- -- -- -- 1.3 -- 1.9    ALANINE TRANSFERASE (ALT) -- -- -- -- -- 12 -- 16    TRIGLYCERIDE -- -- -- -- -- 149 -- --    URIC ACID, BLOOD -- -- -- -- -- -- -- --    LACTATE DEHYDROGENASE -- -- -- -- -- -- -- --    PHOSPHORUS (PO4) -- -- -- -- -- 1.5* -- 5.9*    CHOLESTEROL -- -- -- -- -- -- -- --    MAGNESIUM (MG) -- -- -- -- -- 1.7* 1.9 2.1     @PT    Recent labs for the past 48 hours     05/08/18 1253    PROTHROMBIN TIME 10.6    APTT 23.5*            Rica Koyanagi, MD  Hospitalist / Internal Medicine

## 2018-05-10 NOTE — Allied Health Progress (Signed)
Case Management Assistant Update  05/10/2018  13:50  Clinical update faxed to:  [facility]  [facility]: [phone]  FX: [phone]            Haze Boyden, Case Manager Assistant     Ext # [phone]  05/10/2018    Case Management Assistant Update  05/08/2018  10:14    Clinical update faxed to:  [facility]  [facility]: [phone]  FX: [phone]          Clinton Sawyer, Case Manager Assistant     Ext # [phone]  05/08/2018

## 2018-05-11 LAB — MMC CULTURE, AEROBIC

## 2018-05-11 LAB — POC GLUCOSE
POC GLUCOSE: 136 mg/dL — AB (ref 70–99)
POC GLUCOSE: 351 mg/dL — AB (ref 70–99)
POC GLUCOSE: 309 mg/dL — AB (ref 70–99)
POC GLUCOSE: 326 mg/dL — AB (ref 70–99)

## 2018-05-11 LAB — VANCOMYCIN, TROUGH: VANCOMYCIN, TROUGH_MMC: 21.4 ug/mL — AB (ref 10.0–20.0)

## 2018-05-11 MED ORDER — VANCOMYCIN 1 GRAM/250 ML IN 0.9 % SODIUM CHLORIDE INTRAVENOUS
1.00 g | Freq: Two times a day (BID) | INTRAVENOUS | Status: DC
Start: 2018-05-11 — End: 2018-05-13
  Administered 2018-05-11 – 2018-05-13 (×6): 1 g via INTRAVENOUS
  Filled 2018-05-11 (×7): qty 250, fill #0

## 2018-05-11 MED ORDER — PANTOPRAZOLE 40 MG TABLET,DELAYED RELEASE
40.00 mg | DELAYED_RELEASE_TABLET | Freq: Every day | ORAL | Status: DC
Start: 2018-05-11 — End: 2018-05-14
  Administered 2018-05-11 – 2018-05-14 (×4): 40 mg via ORAL
  Filled 2018-05-11 (×3): qty 1, fill #0

## 2018-05-11 MED FILL — PANTOPRAZOLE 40 MG TABLET,DELAYED RELEASE: 40.00 mg | 0 days supply | Qty: 1 | Fill #0

## 2018-05-11 NOTE — Progress Notes (Signed)
VANCOMYCIN PER PHARMACY FOLLOW UP NOTE    SUBJECTIVE/OBJECTIVE  Diana Marquez is a 73yr old female admitted for abcess/cellulitis    INDICATION: Abcess/Cellulitis  VANCOMYCIN TROUGH GOAL: 15-20 mcg/mL    ALLERGIES: Patient has no known allergies.    Urea Nitrogen, Blood (BUN)   Date Value Ref Range Status   05/10/2018 9 6 - 21 mg/dL Final     Creatinine Serum   Date Value Ref Range Status   05/10/2018 0.79 0.50 - 1.30 mg/dL Final     Vancomycin, Trough   Date Value Ref Range Status   05/11/2018 21.4 (H) 10.0 - 20.0 ug/mL Final     Estimated Creatinine Clearance: 59.2 mL/min (by C-G formula based on SCr of 0.79 mg/dL).  No results found for: VANCO  Temp: 36 C (96.8 F) (02/10 0000)  Temp src: Temporal (02/10 0000)  Pulse: 97 (02/10 0000)  BP: 136/77 (02/10 0000)  Resp: 18 (02/10 0000)  SpO2: 99 % (02/10 0000)  Height: 157.5 cm (5\' 2" ) (02/06 1845)  Weight: 72.7 kg (160 lb 4.4 oz) (02/06 1845)  Temp (24hrs), Avg:36.2 C (97.2 F), Min:36 C (96.8 F), Max:36.8 C (98.2 F)      OTHER ANTIBIOTIC THERAPY:   OTHER NEPHROTOXIC MEDS:     ASSESSMENT/PLAN    Day # 5 of therapy  Trough level 21.4 (supratherapeutic)   Change Vancomycin 1000 mg IV q12H   Trough level ordered prior to 4th dose 05/12/2018 at 1645  Pharmacy to continue to follow patient daily per protocol

## 2018-05-11 NOTE — Plan of Care (Signed)
Problem: Patient Care Overview  Goal: Plan of Care Review  Flowsheets  Taken 05/10/2018 2015 by Leeanne Rio, RN  Plan of Care Reviewed With: patient  Taken 05/11/2018 0057 by Leeanne Rio, RN  Progress: improving  Taken 05/10/2018 1419 by Lilia Pro, RN  Outcome Summary: see eoss  Goal: Interprofessional Rounds/Family Conf  Flowsheets (Taken 05/09/2018 [phone])  Participants: nursing;patient     Problem: Skin Injury Risk (Adult)  Goal: Identify Related Risk Factors and Signs and Symptoms  Description  Related risk factors and signs and symptoms are identified upon initiation of Human Response Clinical Practice Guideline (CPG).  Flowsheets (Taken 05/10/2018 [phone])  Related Risk Factors (Skin Injury Risk): infection;tissue perfusion altered  Goal: Skin Health and Integrity  Description  Patient will demonstrate the desired outcomes by discharge/transition of care.  Flowsheets (Taken 05/11/2018 0057)  Skin Health and Integrity: making progress toward outcome  Intervention: Promote/Optimize Nutrition  Flowsheets (Taken 05/10/2018 2015)  Oral Nutrition Promotion: rest periods promoted  Intervention: Prevent/Manage Excess Moisture  Flowsheets  Taken 05/10/2018 2000 by Kipp Laurence, Technician  Bathing/Skin Care: face care;bedtime care  Perineal Care: perineum cleansed  Taken 05/10/2018 2015 by Leeanne Rio, RN  Skin Protection: adhesive use limited;tubing/devices free from skin contact  Intervention: Maintain Head of Bed Elevation Less Than 30 Degrees as Tolerated  Flowsheets (Taken 05/10/2018 2015)  Head of Bed [facility]): HOB elevated  Intervention: Prevent/Minimize Shear/Friction Injuries  Flowsheets (Taken 05/10/2018 2015)  Pressure Reduction Devices: pressure-redistributing mattress utilized  Positioning/Transfer Devices: pillows;in use  Intervention: Prevent or Minimize Pressure  Flowsheets (Taken 05/10/2018 2015)  Pressure Reduction Techniques: frequent weight shift encouraged  Body Position: supine     Problem: Diabetes, Type 1  (Adult)  Goal: Signs and Symptoms of Listed Potential Problems Will be Absent, Minimized or Managed (Diabetes, Type 1)  Description  Signs and symptoms of listed potential problems will be absent, minimized or managed by discharge/transition of care (reference Diabetes, Type 1 (Adult) CPG).  Flowsheets (Taken 05/10/2018 [phone])  Problems Assessed (Type 1 Diabetes): all  Problems Present (Type 1 Diabetes): hyperglycemia  Intervention: Support/Optimize Psychosocial Response to Condition  Flowsheets (Taken 05/10/2018 2015)  Supportive Measures: active listening utilized;self-care encouraged;verbalization of feelings encouraged;positive reinforcement provided  Family/Support System Care: self-care encouraged  Intervention: Monitor/Manage Signs of Ketoacidosis  Flowsheets (Taken 05/10/2018 2015)  Fluid/Electrolyte Management: fluids provided  Intervention: Optimize Glycemic Control  Flowsheets (Taken 05/10/2018 2015)  Glycemic Management: blood glucose monitoring;oral hydration promoted     Problem: Fall Risk (Adult)  Goal: Identify Related Risk Factors and Signs and Symptoms  Description  Related risk factors and signs and symptoms are identified upon initiation of Human Response Clinical Practice Guideline (CPG).  Flowsheets (Taken 05/10/2018 [phone])  Related Risk Factors (Fall Risk): fatigue/slow reaction;environment unfamiliar  Signs and Symptoms (Fall Risk): presence of risk factors  Goal: Absence of Fall  Description  Patient will demonstrate the desired outcomes by discharge/transition of care.  Flowsheets (Taken 05/11/2018 0057)  Absence of Fall: making progress toward outcome  Intervention: Monitor/Assist with Self Care  Flowsheets (Taken 05/10/2018 2015)  Self-Care Promotion: independence encouraged;BADL personal objects within reach  Activity Assistance Provided: assistance, stand-by  Intervention: Reduce Risk/Promote Restraint Free Environment  Flowsheets (Taken 05/10/2018 2015)  Safety Promotion/Fall Prevention: fall  prevention program maintained;safety round/check completed;nonskid shoes/slippers when out of bed;activity supervised  Environmental Safety Modification: assistive device/personal items within reach;clutter free environment maintained;lighting adjusted  Safety/Security Measures: bed alarm set  Intervention: Review Medications/Identify Contributors to Fall Risk  Flowsheets (Taken 05/10/2018 2015)  Medication Review/Management: medications reviewed     Problem: Pain, Acute (Adult)  Goal: Identify Related Risk Factors and Signs and Symptoms  Description  Related risk factors and signs and symptoms are identified upon initiation of Human Response Clinical Practice Guideline (CPG).  Flowsheets  Taken 05/11/2018 0057  Related Risk Factors (Acute Pain): infection;persistent pain;procedure/treatment  Taken 05/10/2018 1610  Signs and Symptoms (Acute Pain): fatigue/weakness;verbalization of pain descriptors  Goal: Acceptable Pain Control/Comfort Level  Description  Patient will demonstrate the desired outcomes by discharge/transition of care.  Flowsheets (Taken 05/11/2018 0057)  Acceptable Pain Control/Comfort Level: making progress toward outcome  Intervention: Monitor and Manage Analgesia  Flowsheets (Taken 05/10/2018 2015)  Bowel Intervention: adequate fluid intake promoted;ambulation promoted  Medication Review/Management: medications reviewed  Intervention: Mutually Develop/Implement Acute Pain Management Plan  Flowsheets  Taken 05/10/2018 2015  Sensory Stimulation Regulation: care clustered;quiet environment promoted  Taken 05/10/2018 2214  Pain Management Interventions: care clustered;quiet environment facilitated;position adjusted  Intervention: Support/Optimize Psychosocial Response to Acute Pain  Flowsheets (Taken 05/10/2018 2015)  Supportive Measures: active listening utilized;self-care encouraged;verbalization of feelings encouraged;positive reinforcement provided  Diversional Activities: television  Family/Support System Care:  self-care encouraged  Trust Relationship/Rapport: care explained;choices provided;thoughts/feelings acknowledged;questions encouraged     Problem: Infection, Risk/Actual (Adult)  Goal: Identify Related Risk Factors and Signs and Symptoms  Description  Related risk factors and signs and symptoms are identified upon initiation of Human Response Clinical Practice Guideline (CPG).  Flowsheets (Taken 05/10/2018 [phone])  Related Risk Factors (Infection, Risk/Actual): surgery/procedure;skin integrity impairment;chronic illness/condition  Signs and Symptoms (Infection, Risk/Actual): blood glucose changes;pain  Goal: Infection Prevention/Resolution  Description  Patient will demonstrate the desired outcomes by discharge/transition of care.  Flowsheets (Taken 05/11/2018 0057)  Infection Prevention/Resolution: making progress toward outcome  Intervention: Manage Suspected/Actual Infection  Flowsheets  Taken 05/11/2018 0057  Isolation Precautions: contact precautions maintained  Taken 05/10/2018 2214  Fever Reduction/Comfort Measures: medication administered  Taken 05/10/2018 2015  Infection Management: aseptic technique maintained

## 2018-05-11 NOTE — Nurse Focus (Signed)
End of Shift Statement      Patient Progress per diagnosis: A/O   Norco effective for posterior I/D of cyst pain  0.5  Of Dilaudid given prior to dsg change with 1 inch iodoform wrapped in kerlex and coban   Ss drainage noted on packing and kerlex    Significant events:    Patient is independent in repositioning.  The following items are in use for prevention/treatment minimal linens.    Patient's pain/comfort level assessed during shift and interventions for increased comfort were implemented as needed.    Non-pharmacological interventions: repositioning.    Interventions were effective.    Patient was able to increase mobility  Side Effects: none  The patient was compliant with plan of care.  Education provided on medication protonix.    RN Ambulation      Most Recent Value   Ambulation Distance (Feet)  50 Filed at 05/11/2018 1400         Therapy Ambulation      Most Recent Value   Independence Level (Gait)  independent Filed at 05/08/2018 0700            Lilia Pro, RN

## 2018-05-11 NOTE — Plan of Care (Signed)
Problem: Patient Care Overview  Goal: Plan of Care Review  Flowsheets (Taken 05/11/2018 1252)  Plan of Care Reviewed With: patient  Progress: improving  Outcome Summary: see eoss     Problem: Skin Injury Risk (Adult)  Goal: Identify Related Risk Factors and Signs and Symptoms  Description: Related risk factors and signs and symptoms are identified upon initiation of Human Response Clinical Practice Guideline (CPG).  Flowsheets (Taken 05/10/2018 3748 by Leeanne Rio, RN)  Related Risk Factors (Skin Injury Risk):   infection   tissue perfusion altered  Goal: Skin Health and Integrity  Description: Patient will demonstrate the desired outcomes by discharge/transition of care.  Flowsheets (Taken 05/11/2018 1252)  Skin Health and Integrity: making progress toward outcome     Problem: Diabetes, Type 1 (Adult)  Goal: Signs and Symptoms of Listed Potential Problems Will be Absent, Minimized or Managed (Diabetes, Type 1)  Description: Signs and symptoms of listed potential problems will be absent, minimized or managed by discharge/transition of care (reference Diabetes, Type 1 (Adult) CPG).  Flowsheets (Taken 05/10/2018 2707 by Leeanne Rio, RN)  Problems Assessed (Type 1 Diabetes): all  Problems Present (Type 1 Diabetes): hyperglycemia     Problem: Fall Risk (Adult)  Goal: Identify Related Risk Factors and Signs and Symptoms  Description: Related risk factors and signs and symptoms are identified upon initiation of Human Response Clinical Practice Guideline (CPG).  Flowsheets (Taken 05/10/2018 8675 by Leeanne Rio, RN)  Related Risk Factors (Fall Risk):   fatigue/slow reaction   environment unfamiliar  Signs and Symptoms (Fall Risk): presence of risk factors  Goal: Absence of Fall  Description: Patient will demonstrate the desired outcomes by discharge/transition of care.  Flowsheets (Taken 05/11/2018 1252)  Absence of Fall: making progress toward outcome     Problem: Pain, Acute (Adult)  Goal: Identify Related Risk Factors and  Signs and Symptoms  Description: Related risk factors and signs and symptoms are identified upon initiation of Human Response Clinical Practice Guideline (CPG).  Flowsheets  Taken 05/11/2018 0057 by Leeanne Rio, RN  Related Risk Factors (Acute Pain):   infection   persistent pain   procedure/treatment  Taken 05/10/2018 0658 by Leeanne Rio, RN  Signs and Symptoms (Acute Pain):   fatigue/weakness   verbalization of pain descriptors  Goal: Acceptable Pain Control/Comfort Level  Description: Patient will demonstrate the desired outcomes by discharge/transition of care.  Flowsheets (Taken 05/11/2018 1252)  Acceptable Pain Control/Comfort Level: making progress toward outcome     Problem: Infection, Risk/Actual (Adult)  Goal: Identify Related Risk Factors and Signs and Symptoms  Description: Related risk factors and signs and symptoms are identified upon initiation of Human Response Clinical Practice Guideline (CPG).  Flowsheets (Taken 05/10/2018 4492 by Leeanne Rio, RN)  Related Risk Factors (Infection, Risk/Actual):   surgery/procedure   skin integrity impairment   chronic illness/condition  Signs and Symptoms (Infection, Risk/Actual):   blood glucose changes   pain  Goal: Infection Prevention/Resolution  Description: Patient will demonstrate the desired outcomes by discharge/transition of care.  Flowsheets (Taken 05/11/2018 1252)  Infection Prevention/Resolution: making progress toward outcome     Problem: Renal Failure/Kidney Injury, Acute (Adult)  Goal: Signs and Symptoms of Listed Potential Problems Will be Absent, Minimized or Managed (Renal Failure/Kidney Injury, Acute)  Description: Signs and symptoms of listed potential problems will be absent, minimized or managed by discharge/transition of care (reference Renal Failure/Kidney Injury, Acute (Adult) CPG).  Flowsheets (Taken 05/10/2018 0100 by Leeanne Rio, RN)  Problems Assessed (Acute Renal Failure/Kidney Injury): all

## 2018-05-11 NOTE — Allied Health Progress (Signed)
Case Management Assistant Update  05/11/2018  10:37    Update faxed to:  [facility]  [facility]: [phone]  [facility]: [phone]        Clinton Sawyer, Case Manager Assistant     Ext # [phone]  05/11/2018    Case Management Assistant Update  05/10/2018  13:50  Clinical update faxed to:  [facility]  [facility]: [phone]  [facility]: [phone]            Haze Boyden, Case Manager Assistant     Ext # [phone]  05/10/2018    Case Management Assistant Update  05/08/2018  10:14    Clinical update faxed to:  [facility]  [facility]: [phone]  [facility]: [phone]          Clinton Sawyer, Case Manager Assistant     Ext # [phone]  05/08/2018

## 2018-05-11 NOTE — Nurse Focus (Signed)
End of Shift Statement      Patient Progress per diagnosis: Vital signs stable, afebrile, blood sugars 179 and 136, dilaudid given once for severe pain, dressing to posterior neck CDI, up to toilet with SBA and FWW, PICC drsg changed, slept between care.       Significant events: None    Patient is independent in repositioning.  The following items are in use for prevention/treatment minimal linens.    Patient's pain/comfort level assessed during shift and interventions for increased comfort were implemented as needed.    Non-pharmacological interventions: pillow support.    Interventions were effective.    Patient was able to rest comfortably  Side Effects: none  The patient was compliant with plan of care.  Education provided on medication norco and toradol.    RN Ambulation      Most Recent Value   Ambulation Distance (Feet)  100 Filed at 05/11/2018 0104         Therapy Ambulation      Most Recent Value   Independence Level (Gait)  independent Filed at 05/08/2018 0700              Leeanne Rio, RN

## 2018-05-11 NOTE — Progress Notes (Signed)
HOSPITALIST MEDICINE DAILY PROGRESS NOTE    Patient Name: Diana Marquez DOB: 1945/08/26   PCP: Patient, No Pcp Per DOA: 05/07/2018  1:25 PM   Date of service: 05/11/2018 MRN: [id number]              Chief Complaint: Diabetic ketoacidosis, scalp abscess    SUBJECTIVE:   Diana Marquez is doing really well.  She still has some mild neck pain.  No chest pain or shortness of breath.  Note from the weekend hospitalist and the surgeon are reviewed.  Wound culture growing MRSA now she is only on vancomycin.      Review of Systems    MEDICATIONS:    Atorvastatin (LIPITOR) Tablet 40 mg, ORAL, Daily Bedtime  Docusate Calcium (COLACE) Capsule 240 mg, ORAL, QAM  Duloxetine (CYMBALTA) Delayed Release Capsule 60 mg, ORAL, QAM  Enoxaparin (LOVENOX) Injection 40 mg, SUBCUTANEOUS, Q24H  FamoTIDine (PEPCID) Tablet 20 mg, ORAL, BID  Insulin Glargine (LANTUS) 100 unit/mL Injection 27 Units, SUBCUTANEOUS, Daily Bedtime  Insulin Lispro (HUMALOG) Injection 0-15 Units, SUBCUTANEOUS, QID w/meals and bedtime  Lactobacillus (CULTURELLE) Sprinkle Capsule 1 Sprinkle Capsule, ORAL, BIDAC  LevoTHYROxine Tablet 100 mcg, ORAL, QAM AC  Pantoprazole (PROTONIX) Injection 40 mg, IV, Q12H Now  Saline Lock Flush 2.5 mL, IV, Q8H (38,18,29)  Vancomycin (VANCOCIN) 1 g in NaCl 0.9% 250 mL IVPB, IV, Q12H Now, Last Rate: Stopped (05/11/18 9371)  Vancomycin Pharmacy to Dose, NOT APPLICABLE, PATIENT FLAG ONLY      Lactated Ringers, , IV, CONTINUOUS, Last Rate: 1,000 mL (05/11/18 0521)  NaCl 0.45%, , IV, CONTINUOUS, Last Rate: 1,000 mL (05/09/18 0631)      Acetaminophen (TYLENOL) Tablet 325 mg, ORAL, Q4H PRN  Acetaminophen (TYLENOL) Tablet 650 mg, ORAL, Q6H PRN  Alum-Mag Hydroxide-Simeth (MAALOX) 200-200-20 mg/5 mL Suspension 30 mL, ORAL, Q3H PRN  Bisacodyl (DULCOLAX) Suppository 10 mg, RECTALLY, Q24H PRN  Dextrose 50% Injection 25 g, IV, PRN  Glucagon Injection 1 mg, IM, Q20MIN PRN  Hydrocodone 5 mg/Acetaminophen 325 mg (NORCO) Tablet 2 tablet, ORAL, Q4H  PRN  Hydromorphone (DILAUDID) Injection 0.5 mg, IV, Q2H PRN  Ketorolac (TORADOL) Injection 15 mg, IV, Q6H PRN  Lidocaine (XYLOCAINE) 10 mg/mL (1%) Injection 0.2 mL, INTRADERMAL, PRN  Magnesium Hydroxide (MILK OF MAGNESIA) 400 mg/5 mL Suspension 30 mL, ORAL, Q24H PRN  Magnesium Sulfate 1 gram in D5W 100 mL IVPB, IV, PRN, Last Rate: Stopped (05/07/18 2356)  Melatonin Tablet 3 mg, ORAL, Bedtime PRN  Naloxone (NARCAN) Injection 0.08 mg, IV, Q2MIN PRN  Naloxone (NARCAN) Injection 0.2 mg, IV, PRN  Ondansetron (ZOFRAN) Injection 4 mg, IV, Q8H PRN  Potassium Chloride 20 mEq/100 mL in Sterile Water IVPB 20 mEq, IV, PRN    Or  Potassium Chloride 10 mEq  / Lidocaine 10 mg in 0.9% NaCl IVPB, IV, PRN  Saline Lock Flush 2.5 mL, IV, PRN  Sodium Phosphate 15 mmol in NaCl 0.9% 250 mL IVPB, IV, PRN, Last Rate: Stopped (05/08/18 0458)  Sodium Phosphate 7.5 mmol in NaCl 0.9% 100 mL IVPB, IV, PRN  Sodium Phosphates 22.5 mmol in NaCl 0.9% 250 mL IVPB, IV, PRN          OBJECTIVE:   Vital Signs  Summary  Temp Min: 36 C (96.8 F) Max: 36.8 C (98.2 F)  BP: (100-162)/(65-90)   Pulse Min: 79 Max: 103  Resp Min: 18 Max: 20  SpO2 Min: 95 % Max: 100 %      Current Vitals  Temp: 36.4 C (97.5 F)  BP: 100/65  Pulse: 92  Resp: 18  SpO2: 98 %  Flow (L/min): 0   Weight: 72.7 kg (160 lb 4.4 oz)     Intake and Output  Last Two Completed Shifts  In: 2520 [Oral:1320; Crystalloid:1200]  Out: 4400 [Urine:4400]        Physical Exam      General Appearance: healthy, alert, no distress, pleasant affect, cooperative.   HEENT: oral mucosa moist, No JVD, No cervical adenopathy, no nuchal rigidity, dressing at the back of the neck on the right side  Heart: normal rate and regular rhythm, no murmurs, clicks, or gallops.   Lungs: clear to auscultation.   Abdomen: BS normal.  Abdomen soft, non-tender.  No masses or organomegaly.   Extremities: bilateral lower extremity no edema     LINES AND DRAINS: PICC line    LAB TESTS/STUDIES:   I personally  reviewed the following labs and     Recent labs for the past 72 hours     05/10/18 0430 05/09/18 0129 05/08/18 1606    GLUCOSE 301* 117* 243*    UREA NITROGEN, BLOOD (BUN) 9 12 16     CREATININE BLOOD 0.79 0.76 0.98    SODIUM 132* 134 132*    POTASSIUM 3.5 3.6 3.7    CHLORIDE 103 110* 103    CARBON DIOXIDE TOTAL 22 22 18*    CALCIUM 7.7* 7.6* 8.0*      Recent labs for the past 72 hours     05/10/18 0430    WHITE BLOOD CELL COUNT 5.4    HEMOGLOBIN 8.9*    HEMATOCRIT 25.6*    PLATELET COUNT 226        Lab Results   Component Value Date    GS Few Polymorphonuclear cells 05/09/2018    GS Few Gram positive cocci 05/09/2018    BCULT No growth to date 05/07/2018            ASSESSMENT & PLAN:         Diabetic ketoacidosis without coma associated with type 1 diabetes mellitus (HCC) (05/07/2018)  Resolved, continue Lantus adjusted yesterday, follow blood sugars closely    Abscess of scalp (05/07/2018)     Assessment: Secondary to MRSA     Plan: Local wound care, surgery notes reviewed, continue IV vancomycin    Generalized abdominal pain (05/07/2018)     Resolved likely due to DKA    Acute kidney injury (HCC) (05/07/2018)  Resolved    Intractable vomiting with nausea, unspecified vomiting type (05/07/2018)     Resolved            DVT Prophylaxis -Lovenox    Disposition -home  Hitomi Slape Burney Gauze, MD,

## 2018-05-11 NOTE — Allied Health Progress (Addendum)
CASE MANAGEMENT DAILY NOTE    Rounded with MD.  Patient doing well.  Wound positive for MRSA.  Awaiting surgeon review and recommendations based positive cultures.  Watch for wound care FU through [facility] and abx at DC.  CM to continue to assess.      Patient was given a handout informing them of surrounding skilled nursing facility or home health care choices and their star ratings with contact information?  No    Chart reviewed and Case Management to follow and assist with discharge planning and inpatient management as needed.     Junie Panning, RN     Ext # [phone]  05/11/2018  14:02    CASE MANAGEMENT DAILY NOTE      Pt had an ID of scalp abscess- infected sebaceous cyst,  stated likely reason for DKA., stable postoperatively.  CM continuing to follow,likely no needs, Independent with assistance of spouse.  [facility] was involved in this case however pt has opted to stay at [facility] no further communication with the transfer center.    Patient was given a handout informing them of surrounding skilled nursing facility or home health care choices and their star ratings with contact information?  yes    Chart reviewed and Case Management to follow and assist with discharge planning and inpatient management as needed.     Caryl Asp, RN     Ext # [phone]    CASE MANAGEMENT DAILY NOTE    Received call from Jan at [facility] [phone]. She states that they will not need to transfer patient and she remain here for her care at this time. Patient notified.     Chart reviewed and Case Management to follow and assist with discharge planning and inpatient management as needed.     Jobe Igo, Case Manager     Ext # [phone]  05/08/2018  14:27          CASE MANAGEMENT DAILY NOTE    Spoke with Jan at [facility] [phone] and gave her updated report on the patient.  Spoke with patient who would prefer to stay here if possible.  [facility] notified    Patient was given a handout informing them of surrounding skilled nursing facility or  home health care choices and their star ratings with contact information?  no    Chart reviewed and Case Management to follow and assist with discharge planning and inpatient management as needed.     Jobe Igo, Case Manager     Ext # [phone]  05/08/2018  13:08         CASE MANAGEMENT INITIAL PATIENT SCREEN    Patient: Diana Marquez                                    MRN: [id number]  Date of Birth: 1945-08-23 (66yr)                       Gender: female  PCP: Patient, No Pcp Per                             Date of Initial Screen:  05/08/2018 @ 10:58    Type of Contact: Face to Face    Date of Admission: Inpatient Admission Date/Time: 05/07/2018 4:31 PM      Recent ED or Admission Dates:  Insurance Status: Payor: Chiropractor / Plan: [facility]/SENIOR [employer]VANTAGE/MEDICARE RISK / Product Type: *No Product type* /     Is the patient connected to the [place] or have they ever been in the past?   no    Chief Complaint:  Abdominal Pain; Nausea; and Vomiting      Admitting Diagnosis:   Diabetic ketoacidosis without coma associated with type 1 diabetes mellitus (HCC) [E10.10]  Abscess of scalp [L02.811]  Generalized abdominal pain [R10.84]  Intractable vomiting with nausea, unspecified vomiting type [R11.2]  Dehydration [E86.0]  Acute kidney injury (HCC) [N17.9]     Consults:   GENERAL SURGERY CONSULT    Procedures:     Social Determinants: Pt lives with husband Diana Marquez and together they support one another.  There is a walker in the home and husband is currently receiving home health support for wound care to his foot.  Pt has daughter Diana Marquez who is helpful and available when needed. No pcp listed on face sheet but pt states she sees Dr Mechele Claude w/ [facility].    Home Care active: no       Agency and services:    Readmission Risk Score: Risk of Admission or ED Visit: 2    1 - 19 Green   20 - 39 Yellow   > 40 Red      Case Management Services:    Complete Comprehensive Assessment    no  (.cmcomp)        Chart reviewed and  Case Management to follow and assist with discharge planning and inpatient management as needed.      Selinda Orion, RN  Ext [phone]  05/08/2018 @ 10:58  05/09/2018  13:32

## 2018-05-11 NOTE — Progress Notes (Signed)
GENERAL SURGERY PROGRESS NOTE    Note Date and Time: 05/11/2018 17:53 Date of Admission: 05/07/2018  1:25 PM    Diana Marquez  MRN: [id number]  Patient's PCP: Patient, No Pcp Per      Summary:   Diana Marquez is a 73yr old female who is s/p Procedure(s) with comments:  EXCISION, LESION, SCALP (Right) - excisional biopsy of right posterior scalp infected epidermal inclusion cyst performed 05/09/2018    24hr Interval Events:   - Patient rates pain at mild.  Much improved.   Slept well.  - Patient denies nausea/vomiting.    - No subjective fevers, chills, or sweats.    - Tolerating a diet.    - nurse changed dressing yesterday       Vital Signs   Current Vitals  Temp: 36.1 C (97 F) (05/11/18 1600)  BP: (!) 157/94 (05/11/18 1600) Pulse: 108 (05/11/18 1600)  Resp: 16 (05/11/18 1600)  SpO2: 96 % (05/11/18 1600)      Weight: 72.7 kg (160 lb 4.4 oz) (05/07/18 1845)  24 hour Summary:   Temp Min: 36 C (96.8 F) Max: 36.8 C (98.2 F)  BP: (100-157)/(65-94)   Pulse Min: 92 Max: 112  Resp Min: 16 Max: 20  SpO2 Min: 96 % Max: 99 %  No Data Recorded         I/O:      Intake/Output Summary (Last 24 hours) at 05/11/2018 1753  Last data filed at 05/11/2018 1600  Gross per 24 hour   Intake 2480 ml   Output 4300 ml   Net -1820 ml       Physical Exam:  Gen:  NAD, Alert and orientated x 3  Abd:  Soft, nontender, mild distention        Labs:    Recent labs for the past 48 hours     05/10/18 0430    SODIUM 132*    POTASSIUM 3.5    CHLORIDE 103    CARBON DIOXIDE TOTAL 22    UREA NITROGEN, BLOOD (BUN) 9    CREATININE BLOOD 0.79    GLUCOSE 301*      Recent labs for the past 48 hours     05/10/18 0430    WHITE BLOOD CELL COUNT 5.4    HEMOGLOBIN 8.9*    HEMATOCRIT 25.6*    PLATELET COUNT 226        No results found for this basename: INR:2,PTT:2 in the last 48 hours     No results found for this basename: TP:*,ALB:*,TBIL:*,ALP:*,AST:*,ALT:*,BILID:* in the last 48 hours    24hr Imaging:   [facility] Ct Abdomen Pelvis W/o  Contrast    Result Date: 05/07/2018  [facility] CT ABDOMEN PELVIS WITHOUT CONTRAST INDICATION: 73 year old female with vomiting, abdominal pain. Leukocytosis. COMPARISON: None. TECHNIQUE: Unenhanced CT images of the abdomen and pelvis were obtained and reconstructed in multiple planes. Some images are degraded by motion. FINDINGS: LOWER THORAX:  No pleural or pericardial effusions are identified. The visualized bilateral lung bases appear clear. Small fat-containing eventrations are identified at the posterior right hemidiaphragm as seen on axial series 3 image 13. CT ABDOMEN: LIVER AND SPLEEN:  No discrete hepatic or splenic abnormality is detected. GALLBLADDER, COMMON BILE DUCT AND PANCREAS:  The gallbladder is within normal limits.  The common bile duct is normal in diameter.  The pancreas is grossly unremarkable accounting for motion artifact. ADRENALS AND KIDNEYS:  The left adrenal is unremarkable.  The right adrenal is unremarkable.  No  discrete renal mass lesion, hydronephrosis or nephrolithiasis is seen. AORTA AND RETROPERITONEUM:  Mild scattered atherosclerotic calcifications are identified at the abdominal aorta without aneurysmal dilatation. There is mild flattening of the IVC.  No retroperitoneal mass or adenopathy. STOMACH AND SMALL BOWEL:  The stomach contains a small amount of fluid and gas, otherwise unremarkable.  Unopacified small bowel loops normal in caliber. CT PELVIS: REPRODUCTIVE ORGANS:  The uterus is not definitely appreciated. No discrete adnexal abnormality is identified. URETERS AND URINARY BLADDER:  The urinary bladder within normal limits.  The right ureter is unremarkable.  The left ureter is unremarkable. ADENOPATHY/MASSES:  No pelvic sidewall, obturator nor inguinal adenopathy. RECTUM AND COLON:  There is mild distal colonic diverticulosis without evidence of acute diverticulitis. Small to moderate stool is noted within the colon. Moderate loose enteric contents are identified within the  cecum and ascending colon on coronal image 15. TERMINAL ILEUM AND APPENDIX:  The terminal ileum is within normal limits.  There appears to be a short appendix on coronal image 18, which is otherwise unremarkable.Marland Kitchen VENTRAL WALL:  No umbilical, ventral wall nor inguinal hernias. FREE FLUID/AIR:  No free fluid, no free air. BONES:  No acute osseous abnormality is seen. Prominent posterior facet arthropathy is identified at the lumbar spine. 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 There is levoconvex curvature of the lumbar spine. Severe discogenic degenerative changes are identified at L1-L2 and L2-L3. There is nonacute-appearing loss of height at the inferior endplate of vertebral body L1 best appreciated on sagittal image 36. There is grade 1 anterolisthesis of L4 on L5, probably degenerative.     IMPRESSION: 1.  No acute intra-abdominal or pelvic abnormality identified. Normal appendix. 2.  Moderate loose enteric contents within the cecum and ascending colon may be secondary to low-grade enteritis. No evidence of bowel obstruction. 3.  Mild distal colonic diverticulosis without evidence of acute diverticulitis. Radiation dose report: CTDIvol 13.18 mGy DLP 676.9 mGy*cm All CT scans at [facility] and [facility] are performed using dose optimization techniques as appropriate to a performed exam including the following: automated exposure control, adjustment of the mA and/or kV according to patient size, and/or use of iterative reconstruction technique.     [facility] Chest 2 Views    Result Date: 05/07/2018  DATE of Exam:  05/07/2018 14:15 INDICATION: Female, age 73 years.  Respiratory distress. COMPARISON: None TECHNIQUE:  2 views of the chest were obtained. FINDINGS:  The lungs are clear. The cardiomediastinal silhouette is within normal limits. No acute osseous abnormality is seen.     IMPRESSION: No acute cardiopulmonary process is identified.       A/P: Diana Marquez is a 73yr  old female with severely infected scalp abscess s/p Procedure(s) with comments:  EXCISION, LESION, SCALP (Right) - excisional biopsy of right posterior scalp infected epidermal inclusion cyst performed 05/09/2018    - very infected: continue IV antibiotics, await cultures.  - dressing changes daily, with iodoform packing strips or plain packing strips.    - teach dressing changes to family, or set up home health.  - set up outpatient wound care appt.    Diana Marquez, Diana Marquez

## 2018-05-12 LAB — MMC CULTURE, AEROBIC

## 2018-05-12 LAB — POC GLUCOSE
POC GLUCOSE: 109 mg/dL — AB (ref 70–99)
POC GLUCOSE: 65 mg/dL — AB (ref 70–99)
POC GLUCOSE: 314 mg/dL — AB (ref 70–99)
POC GLUCOSE: 257 mg/dL — AB (ref 70–99)
POC GLUCOSE: 155 mg/dL — AB (ref 70–99)

## 2018-05-12 LAB — MMC CULTURE, BLOOD
CULTURE BLOOD: NO GROWTH
CULTURE BLOOD: NO GROWTH

## 2018-05-12 NOTE — Nurse Focus (Signed)
End of Shift Statement      Patient Progress per diagnosis: BP at HS 153/92, otherwise VSS. Afebrile. Good relief of posterior head pain w Dilaudid and Norco 2 tabs po - each given x1. Drsg to head CDI. Up ad lib in room w/o difficulty. FSBS 326 at HS and 65 this am - Tonsina juice given this am.    Significant events: Am FSBS 65.    Patient is independent in repositioning.  The following items are in use for prevention/treatment minimal linens.    Patient's pain/comfort level assessed during shift and interventions for increased comfort were implemented as needed.    Non-pharmacological interventions: diversional activity, repositioning and pillow support.    Interventions were effective.    Patient was able to participate in ADL's  and rest comfortably  Side Effects: none  The patient was compliant with plan of care.  Education provided on medication Dilaudid, Norco and Vancomycin.Marland Kitchen    RN Ambulation      Most Recent Value   Ambulation Distance (Feet)  1000 Filed at 05/11/2018 1800         Therapy Ambulation      Most Recent Value   Independence Level (Gait)  independent Filed at 05/08/2018 0700        Lowella Dandy, RN

## 2018-05-12 NOTE — Plan of Care (Signed)
Problem: Patient Care Overview  Goal: Plan of Care Review  Outcome: Ongoing (interventions implemented as appropriate)  Flowsheets  Taken 05/12/2018 0410 by Lowella Dandy, RN  Plan of Care Reviewed With: patient  Progress: improving  Taken 05/11/2018 1252 by Lilia Pro, RN  Outcome Summary: see eoss  Goal: Individualization and Mutuality  Outcome: Ongoing (interventions implemented as appropriate)  Flowsheets (Taken 05/07/2018 1955 by Neill Loft, RN)  What Information Would Help [place] Give You More Personalized Care?: I can't think of anything.  What Anxieties, Fears, Concerns, or Questions Do You Have About Your Care?: Just worried about my husband because he uses a walker to get to the bathroom and needs help cleaning himself. Her brother is going to stay with him tonight.  Patient Specific Goals (Include Timeframe): Wants to get better quickly so she can go home and take care of her husband. She is his caregiver.  Patient Specific Interventions: Nothing really  How to Address Anxieties/Fears: You can't really do anything from here.  How Would You and/or Your Support Person Like to Participate in Your Care?: Just keep me up to date on the tx plan  Goal: Discharge Needs Assessment  Outcome: Ongoing (interventions implemented as appropriate)  Goal: Interprofessional Rounds/Family Conf  Outcome: Ongoing (interventions implemented as appropriate)

## 2018-05-12 NOTE — Nurse Focus (Signed)
End of Shift Statement      Patient Progress per diagnosis: Changed dsg per orders   Got the tangles and matted hair combed out pt tolerated well  Case mgmt working on diff plans for DC    Significant events:    Patient is independent in repositioning.  The following items are in use for prevention/treatment minimal linens.    Patient's pain/comfort level assessed during shift and interventions for increased comfort were implemented as needed.    Non-pharmacological interventions: pillow support.    Interventions were effective.    Patient was able to increase mobility  Side Effects: none  The patient was compliant with plan of care.  Education provided on procedure Dsg change.    RN Ambulation      Most Recent Value   Ambulation Distance (Feet)  100 Filed at 05/12/2018 0531         Therapy Ambulation      Most Recent Value   Independence Level (Gait)  independent Filed at 05/08/2018 0700              Lilia Pro, RN

## 2018-05-12 NOTE — Progress Notes (Signed)
HOSPITALIST MEDICINE DAILY PROGRESS NOTE    Patient Name: Diana Marquez DOB: 1946-03-16   PCP: Patient, No Pcp Per DOA: 05/07/2018  1:25 PM   Date of service: 05/12/2018 MRN: [id number]              Chief Complaint: Neck pain    SUBJECTIVE:   Diana Marquez is doing really well she has no new complaints.  Notes from Dr. Liborio Nixon reviewed.  She has a bad infection and right posterior scalp epidermoid cyst.  Patient is on vancomycin for MRSA.      Review of Systems    MEDICATIONS:    Atorvastatin (LIPITOR) Tablet 40 mg, ORAL, Daily Bedtime  Docusate Calcium (COLACE) Capsule 240 mg, ORAL, QAM  Duloxetine (CYMBALTA) Delayed Release Capsule 60 mg, ORAL, QAM  Enoxaparin (LOVENOX) Injection 40 mg, SUBCUTANEOUS, Q24H  FamoTIDine (PEPCID) Tablet 20 mg, ORAL, BID  Insulin Glargine (LANTUS) 100 unit/mL Injection 27 Units, SUBCUTANEOUS, Daily Bedtime  Insulin Lispro (HUMALOG) Injection 0-15 Units, SUBCUTANEOUS, QID w/meals and bedtime  Lactobacillus (CULTURELLE) Sprinkle Capsule 1 Sprinkle Capsule, ORAL, BIDAC  LevoTHYROxine Tablet 100 mcg, ORAL, QAM AC  Pantoprazole (PROTONIX) Delayed Release Tablet 40 mg, ORAL, QAM AC  Saline Lock Flush 2.5 mL, IV, Q8H (95,18,84)  Vancomycin (VANCOCIN) 1 g in NaCl 0.9% 250 mL IVPB, IV, Q12H Now, Last Rate: Stopped (05/12/18 0615)  Vancomycin Pharmacy to Dose, NOT APPLICABLE, PATIENT FLAG ONLY         Acetaminophen (TYLENOL) Tablet 325 mg, ORAL, Q4H PRN  Acetaminophen (TYLENOL) Tablet 650 mg, ORAL, Q6H PRN  Alum-Mag Hydroxide-Simeth (MAALOX) 200-200-20 mg/5 mL Suspension 30 mL, ORAL, Q3H PRN  Bisacodyl (DULCOLAX) Suppository 10 mg, RECTALLY, Q24H PRN  Dextrose 50% Injection 25 g, IV, PRN  Glucagon Injection 1 mg, IM, Q20MIN PRN  Hydrocodone 5 mg/Acetaminophen 325 mg (NORCO) Tablet 2 tablet, ORAL, Q4H PRN  Hydromorphone (DILAUDID) Injection 0.5 mg, IV, Q2H PRN  Ketorolac (TORADOL) Injection 15 mg, IV, Q6H PRN  Lidocaine (XYLOCAINE) 10 mg/mL (1%) Injection 0.2 mL, INTRADERMAL, PRN  Magnesium Hydroxide  (MILK OF MAGNESIA) 400 mg/5 mL Suspension 30 mL, ORAL, Q24H PRN  Magnesium Sulfate 1 gram in D5W 100 mL IVPB, IV, PRN, Last Rate: Stopped (05/07/18 2356)  Melatonin Tablet 3 mg, ORAL, Bedtime PRN  Naloxone (NARCAN) Injection 0.08 mg, IV, Q2MIN PRN  Naloxone (NARCAN) Injection 0.2 mg, IV, PRN  Ondansetron (ZOFRAN) Injection 4 mg, IV, Q8H PRN  Potassium Chloride 20 mEq/100 mL in Sterile Water IVPB 20 mEq, IV, PRN    Or  Potassium Chloride 10 mEq  / Lidocaine 10 mg in 0.9% NaCl IVPB, IV, PRN  Saline Lock Flush 2.5 mL, IV, PRN  Sodium Phosphate 15 mmol in NaCl 0.9% 250 mL IVPB, IV, PRN, Last Rate: Stopped (05/08/18 0458)  Sodium Phosphate 7.5 mmol in NaCl 0.9% 100 mL IVPB, IV, PRN  Sodium Phosphates 22.5 mmol in NaCl 0.9% 250 mL IVPB, IV, PRN          OBJECTIVE:   Vital Signs  Summary  Temp Min: 35.8 C (96.5 F) Max: 36.8 C (98.2 F)  BP: (113-157)/(69-94)   Pulse Min: 84 Max: 108  Resp Min: 14 Max: 18  SpO2 Min: 96 % Max: 98 %      Current Vitals  Temp: 36.1 C (97 F)  BP: 113/74  Pulse: 84  Resp: 18  SpO2: 97 %  Flow (L/min): 0   Weight: 72.7 kg (160 lb 4.4 oz)     Intake and Output  Last Two Completed Shifts  In: 1916 [Oral:1530; Crystalloid:386]  Out: 2025 [Urine:2025]        Physical Exam      General Appearance: healthy, alert, no distress, pleasant affect, cooperative.   HEENT: oral mucosa moist, No JVD, No cervical adenopathy, no nuchal rigidity, patient has irregular wound at the right posterior scalp cervical area with some induration and tenderness  Heart: normal rate and regular rhythm, no murmurs, clicks, or gallops.   Lungs: clear to auscultation.   Abdomen: BS normal.  Abdomen soft, non-tender.  No masses or organomegaly.   Extremities: bilateral lower extremity no edema   Skin: Skin color, texture, turgor normal. No rashes or lesions.          LAB TESTS/STUDIES:   I personally reviewed the following labs and     Recent labs for the past 72 hours     05/10/18 0430    GLUCOSE 301*     UREA NITROGEN, BLOOD (BUN) 9    CREATININE BLOOD 0.79    SODIUM 132*    POTASSIUM 3.5    CHLORIDE 103    CARBON DIOXIDE TOTAL 22    CALCIUM 7.7*      Recent labs for the past 72 hours     05/10/18 0430    WHITE BLOOD CELL COUNT 5.4    HEMOGLOBIN 8.9*    HEMATOCRIT 25.6*    PLATELET COUNT 226        Lab Results   Component Value Date    GS Few Polymorphonuclear cells 05/09/2018    GS Few Gram positive cocci 05/09/2018    BCULT No growth to date 05/07/2018            ASSESSMENT & PLAN:         Diabetic ketoacidosis without coma associated with type 1 diabetes mellitus (HCC) (05/07/2018)    Resolved  Continue insulin    Abscess of scalp (05/07/2018)  Status post incision and duration of the right posterior cervical epidermal inclusion cyst  Which is infected with MRSA  Patient still has significant swelling without fluctuation  Continue vancomycin    DVT Prophylaxis -Lovenox    Disposition -home when ready    Arthuro Canelo Burney Gauze, MD,

## 2018-05-12 NOTE — Allied Health Progress (Addendum)
CASE MANAGEMENT DAILY NOTE    Rounded with MD.  Patient continues on IV vancomycin for MRSA wound at right posterior scalp epidermoid cyst site..  Followed by Dr. Liborio Nixon.      Discussed with patient.  Patient's husband is disable and has HH to assist him at home.  Patient spouse is unable to assist with packing/wound care for patient.  Discussed possible HH/WCC through [facility]. Daughter can assist on weekends.      Discussed possible DC with IV abx and need for HH/WCC with [facility].  Daphne at [facility] indicated that we would have to arrange IV teaching etc. And this would need to be approved in addition for any HH/WCC needs.  [facility] dougts patient would be approved [facility] both [facility] and WCC if she is ambulatory. CM to FU with pharmacy regarding anticipated duration of IV abx for MRSA wound.    Discussed with pharmacy.  Pharmacist suggests 7-10 days of vancomycin depending on how wound is progressing.  Patient is diabetic.  CM to FU with new culture results with 6018  Tomorrow for IV abx duration and surgeon regarding progression of wound and wound care needs at DC.      Dr. Liborio Nixon paged to discuss DC plan.      Fannie Knee in pharmacy indicated that patient may be able to complete IV vanco tomorrow and DC on bactrim PO if surgeon agrees based on healing of wound.  RN updated.      Patient was given a handout informing them of surrounding skilled nursing facility or home health care choices and their star ratings with contact information? No, [facility] patient    Chart reviewed and Case Management to follow and assist with discharge planning and inpatient management as needed.     Junie Panning, RN     Ext # [phone]  05/12/2018  15:02    CASE MANAGEMENT DAILY NOTE    Rounded with MD.  Patient doing well.  Wound positive for MRSA.  Awaiting surgeon review and recommendations based positive cultures.  Watch for wound care FU through [facility] and abx at DC.  CM to continue to assess.      Patient was given a handout informing them of  surrounding skilled nursing facility or home health care choices and their star ratings with contact information?  No    Chart reviewed and Case Management to follow and assist with discharge planning and inpatient management as needed.     Junie Panning, RN     Ext # [phone]  05/11/2018  14:02    CASE MANAGEMENT DAILY NOTE      Pt had an ID of scalp abscess- infected sebaceous cyst,  stated likely reason for DKA., stable postoperatively.  CM continuing to follow,likely no needs, Independent with assistance of spouse.  [facility] was involved in this case however pt has opted to stay at [facility] no further communication with the transfer center.    Patient was given a handout informing them of surrounding skilled nursing facility or home health care choices and their star ratings with contact information?  yes    Chart reviewed and Case Management to follow and assist with discharge planning and inpatient management as needed.     Caryl Asp, RN     Ext # [phone]    CASE MANAGEMENT DAILY NOTE    Received call from Jan at [facility] [phone]. She states that they will not need to transfer patient and she remain here for her care  at this time. Patient notified.     Chart reviewed and Case Management to follow and assist with discharge planning and inpatient management as needed.     Jobe Igo, Case Manager     Ext # [phone]  05/08/2018  14:27          CASE MANAGEMENT DAILY NOTE    Spoke with Jan at [facility] [phone] and gave her updated report on the patient.  Spoke with patient who would prefer to stay here if possible.  [facility] notified    Patient was given a handout informing them of surrounding skilled nursing facility or home health care choices and their star ratings with contact information?  no    Chart reviewed and Case Management to follow and assist with discharge planning and inpatient management as needed.     Jobe Igo, Case Manager     Ext # [phone]  05/08/2018  13:08         CASE MANAGEMENT INITIAL PATIENT  SCREEN    Patient: Diana Marquez                                    MRN: [id number]  Date of Birth: 1945/08/13 (86yr)                       Gender: female  PCP: Patient, No Pcp Per                             Date of Initial Screen:  05/08/2018 @ 10:58    Type of Contact: Face to Face    Date of Admission: Inpatient Admission Date/Time: 05/07/2018 4:31 PM      Recent ED or Admission Dates:            Insurance Status: Payor: [facility] [employer] [employer] / Plan: [facility]/MEDICARE RISK / Product Type: *No Product type* /     Is the patient connected to the [facility] or have they ever been in the past?   no    Chief Complaint:  Abdominal Pain; Nausea; and Vomiting      Admitting Diagnosis:   Diabetic ketoacidosis without coma associated with type 1 diabetes mellitus (HCC) [E10.10]  Abscess of scalp [L02.811]  Generalized abdominal pain [R10.84]  Intractable vomiting with nausea, unspecified vomiting type [R11.2]  Dehydration [E86.0]  Acute kidney injury (HCC) [N17.9]     Consults:   GENERAL SURGERY CONSULT    Procedures:     Social Determinants: Pt lives with husband Diana Marquez and together they support one another.  There is a walker in the home and husband is currently receiving home health support for wound care to his foot.  Pt has daughter Diana Marquez who is helpful and available when needed. No pcp listed on face sheet but pt states she sees Dr Mechele Claude w/ [facility].    Home Care active: no       Agency and services:    Readmission Risk Score: Risk of Admission or ED Visit: 2    1 - 19 Green   20 - 39 Yellow   > 40 Red      Case Management Services:    Complete Comprehensive Assessment    no  (.cmcomp)        Chart reviewed and Case Management to follow and assist with discharge planning and inpatient management as  needed.      Selinda OrionSheila R Kennedy, RN  Ext [phone]  05/08/2018 @ 10:58  05/09/2018  13:32

## 2018-05-12 NOTE — Allied Health Progress (Signed)
Case Management Assistant Update  05/12/2018  10:24      Update faxed to:  [facility]  [facility]: [phone]  FX: [phone]    Clinton Sawyer, Case Manager Assistant     Ext # [phone]  05/12/2018    Case Management Assistant Update  05/11/2018  10:37    Update faxed to:  [facility]  [facility]: [phone]  FX: [phone]        Clinton Sawyer, Case Manager Assistant     Ext # [phone]  05/11/2018    Case Management Assistant Update  05/10/2018  13:50  Clinical update faxed to:  [facility]  [facility]: [phone]  FX: [phone]            Haze Boyden, Case Manager Assistant     Ext # [phone]  05/10/2018    Case Management Assistant Update  05/08/2018  10:14    Clinical update faxed to:  [facility]  [facility]: [phone]  FX: [phone]          Clinton Sawyer, Case Manager Assistant     Ext # [phone]  05/08/2018

## 2018-05-12 NOTE — Progress Notes (Addendum)
GENERAL SURGERY PROGRESS NOTE    Note Date and Time: 05/12/2018 19:28 Date of Admission: 05/07/2018  1:25 PM    Diana Marquez  MRN: [id number]  Patient's PCP: Patient, No Pcp Per      Summary:   Diana Marquez is a 73yr old female who is s/p Procedure(s) with comments:  EXCISION, LESION, SCALP (Right) - excisional biopsy of right posterior scalp infected epidermal inclusion cyst performed 05/09/2018    24hr Interval Events:     - Patient rates pain at mild.  Much improved.  Slept well.  - Patient denies nausea/vomiting.    - No subjective fevers, chills, or sweats.    - Tolerating a diet.     Vital Signs   Current Vitals  Temp: 36.1 C (97 F) (05/12/18 1600)  BP: 149/88 (05/12/18 1600) Pulse: 98 (05/12/18 1600)  Resp: 16 (05/12/18 1600)  SpO2: 97 % (05/12/18 1600)      Weight: 72.7 kg (160 lb 4.4 oz) (05/07/18 1845)  24 hour Summary:   Temp Min: 35.8 C (96.4 F) Max: 36.8 C (98.2 F)  BP: (113-153)/(69-92)   Pulse Min: 84 Max: 98  Resp Min: 14 Max: 18  SpO2 Min: 97 % Max: 98 %  No Data Recorded         I/O:    Intake/Output Summary (Last 24 hours) at 05/12/2018 1931  Last data filed at 05/12/2018 1400  Gross per 24 hour   Intake 1756 ml   Output 1300 ml   Net 456 ml       Physical Exam:  Gen:  NAD, Alert and orientated x 3   Scalp: right scalp mild tenderness, large open, gently packed with packing strips, no granulation tissue. No erythema.  Mild tenderness.     24hr Imaging:   [facility] Ct Abdomen Pelvis W/o Contrast    Result Date: 05/07/2018  [facility] CT ABDOMEN PELVIS WITHOUT CONTRAST INDICATION: 73 year old female with vomiting, abdominal pain. Leukocytosis. COMPARISON: None. TECHNIQUE: Unenhanced CT images of the abdomen and pelvis were obtained and reconstructed in multiple planes. Some images are degraded by motion. FINDINGS: LOWER THORAX:  No pleural or pericardial effusions are identified. The visualized bilateral lung bases appear clear. Small fat-containing eventrations are identified at the posterior  right hemidiaphragm as seen on axial series 3 image 13. CT ABDOMEN: LIVER AND SPLEEN:  No discrete hepatic or splenic abnormality is detected. GALLBLADDER, COMMON BILE DUCT AND PANCREAS:  The gallbladder is within normal limits.  The common bile duct is normal in diameter.  The pancreas is grossly unremarkable accounting for motion artifact. ADRENALS AND KIDNEYS:  The left adrenal is unremarkable.  The right adrenal is unremarkable.  No discrete renal mass lesion, hydronephrosis or nephrolithiasis is seen. AORTA AND RETROPERITONEUM:  Mild scattered atherosclerotic calcifications are identified at the abdominal aorta without aneurysmal dilatation. There is mild flattening of the IVC.  No retroperitoneal mass or adenopathy. STOMACH AND SMALL BOWEL:  The stomach contains a small amount of fluid and gas, otherwise unremarkable.  Unopacified small bowel loops normal in caliber. CT PELVIS: REPRODUCTIVE ORGANS:  The uterus is not definitely appreciated. No discrete adnexal abnormality is identified. URETERS AND URINARY BLADDER:  The urinary bladder within normal limits.  The right ureter is unremarkable.  The left ureter is unremarkable. ADENOPATHY/MASSES:  No pelvic sidewall, obturator nor inguinal adenopathy. RECTUM AND COLON:  There is mild distal colonic diverticulosis without evidence of acute diverticulitis. Small to moderate stool is noted within the colon. Moderate  loose enteric contents are identified within the cecum and ascending colon on coronal image 15. TERMINAL ILEUM AND APPENDIX:  The terminal ileum is within normal limits.  There appears to be a short appendix on coronal image 18, which is otherwise unremarkable.Marland Kitchen VENTRAL WALL:  No umbilical, ventral wall nor inguinal hernias. FREE FLUID/AIR:  No free fluid, no free air. BONES:  No acute osseous abnormality is seen. Prominent posterior facet arthropathy is identified at the lumbar spine. 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 There is levoconvex  curvature of the lumbar spine. Severe discogenic degenerative changes are identified at L1-L2 and L2-L3. There is nonacute-appearing loss of height at the inferior endplate of vertebral body L1 best appreciated on sagittal image 36. There is grade 1 anterolisthesis of L4 on L5, probably degenerative.     IMPRESSION: 1.  No acute intra-abdominal or pelvic abnormality identified. Normal appendix. 2.  Moderate loose enteric contents within the cecum and ascending colon may be secondary to low-grade enteritis. No evidence of bowel obstruction. 3.  Mild distal colonic diverticulosis without evidence of acute diverticulitis. Radiation dose report: CTDIvol 13.18 mGy DLP 676.9 mGy*cm All CT scans at [facility] are performed using dose optimization techniques as appropriate to a performed exam including the following: automated exposure control, adjustment of the mA and/or kV according to patient size, and/or use of iterative reconstruction technique.     [facility] Chest 2 Views    Result Date: 05/07/2018  DATE of Exam:  05/07/2018 14:15 INDICATION: Female, age 62 years.  Respiratory distress. COMPARISON: None TECHNIQUE:  2 views of the chest were obtained. FINDINGS:  The lungs are clear. The cardiomediastinal silhouette is within normal limits. No acute osseous abnormality is seen.     IMPRESSION: No acute cardiopulmonary process is identified.       A/P: Diana Marquez is a 73yr old female with severely infected scalp abscess s/p Procedure(s) with comments:  EXCISION, LESION, SCALP (Right) - excisional biopsy of right posterior scalp infected epidermal inclusion cyst performed 05/09/2018    - very infected: continue IV antibiotics, MRSA.    - dressing changes daily, with iodoform packing strips or plain packing strips.    - teach dressing changes to family, or set up home health.  - set up outpatient wound care appt.     Jerald Kief, MD FACS

## 2018-05-13 LAB — POC GLUCOSE
POC GLUCOSE: 70 mg/dL (ref 70–99)
POC GLUCOSE: 178 mg/dL — AB (ref 70–99)
POC GLUCOSE: 163 mg/dL — AB (ref 70–99)
POC GLUCOSE: 286 mg/dL — AB (ref 70–99)

## 2018-05-13 LAB — MMC CULTURE, ANAEROBIC

## 2018-05-13 LAB — VANCOMYCIN, TROUGH: VANCOMYCIN, TROUGH_MMC: 23.9 ug/mL — AB (ref 10.0–20.0)

## 2018-05-13 MED ORDER — VANCOMYCIN 1.25 GRAM/250 ML IN 0.9 % SODIUM CHLORIDE INTRAVENOUS
1.25 g | INTRAVENOUS | Status: DC
Start: 2018-05-14 — End: 2018-05-14
  Filled 2018-05-13: qty 250, fill #0

## 2018-05-13 NOTE — Progress Notes (Addendum)
HOSPITALIST MEDICINE DAILY PROGRESS NOTE    Patient Name: Diana Marquez DOB: 04-25-1945   PCP: Patient, No Pcp Per DOA: 05/07/2018  1:25 PM   Date of service: 05/13/2018 MRN: [id number]              Chief Complaint: Abdominal pain, nausea, vomiting    SUBJECTIVE:   [name] is doing well.  She still has some neck pain.  No fever or chills or chest pain or shortness breath.  Notes from Dr. Liborio Nixon are reviewed.      Review of Systems    MEDICATIONS:    Atorvastatin (LIPITOR) Tablet 40 mg, ORAL, Daily Bedtime  Docusate Calcium (COLACE) Capsule 240 mg, ORAL, QAM  Duloxetine (CYMBALTA) Delayed Release Capsule 60 mg, ORAL, QAM  Enoxaparin (LOVENOX) Injection 40 mg, SUBCUTANEOUS, Q24H  FamoTIDine (PEPCID) Tablet 20 mg, ORAL, BID  Insulin Glargine (LANTUS) 100 unit/mL Injection 27 Units, SUBCUTANEOUS, Daily Bedtime  Insulin Lispro (HUMALOG) Injection 0-15 Units, SUBCUTANEOUS, QID w/meals and bedtime  Lactobacillus (CULTURELLE) Sprinkle Capsule 1 Sprinkle Capsule, ORAL, BIDAC  LevoTHYROxine Tablet 100 mcg, ORAL, QAM AC  Pantoprazole (PROTONIX) Delayed Release Tablet 40 mg, ORAL, QAM AC  Saline Lock Flush 2.5 mL, IV, Q8H (16,01,09)  Vancomycin (VANCOCIN) 1 g in NaCl 0.9% 250 mL IVPB, IV, Q12H Now, Last Rate: Stopped (05/13/18 0615)  Vancomycin Pharmacy to Dose, NOT APPLICABLE, PATIENT FLAG ONLY         Acetaminophen (TYLENOL) Tablet 325 mg, ORAL, Q4H PRN  Acetaminophen (TYLENOL) Tablet 650 mg, ORAL, Q6H PRN  Alum-Mag Hydroxide-Simeth (MAALOX) 200-200-20 mg/5 mL Suspension 30 mL, ORAL, Q3H PRN  Bisacodyl (DULCOLAX) Suppository 10 mg, RECTALLY, Q24H PRN  Dextrose 50% Injection 25 g, IV, PRN  Glucagon Injection 1 mg, IM, Q20MIN PRN  Hydrocodone 5 mg/Acetaminophen 325 mg (NORCO) Tablet 2 tablet, ORAL, Q4H PRN  Hydromorphone (DILAUDID) Injection 0.5 mg, IV, Q2H PRN  Ketorolac (TORADOL) Injection 15 mg, IV, Q6H PRN  Lidocaine (XYLOCAINE) 10 mg/mL (1%) Injection 0.2 mL, INTRADERMAL, PRN  Magnesium Hydroxide (MILK OF MAGNESIA) 400  mg/5 mL Suspension 30 mL, ORAL, Q24H PRN  Magnesium Sulfate 1 gram in D5W 100 mL IVPB, IV, PRN, Last Rate: Stopped (05/07/18 2356)  Melatonin Tablet 3 mg, ORAL, Bedtime PRN  Naloxone (NARCAN) Injection 0.08 mg, IV, Q2MIN PRN  Naloxone (NARCAN) Injection 0.2 mg, IV, PRN  Ondansetron (ZOFRAN) Injection 4 mg, IV, Q8H PRN  Potassium Chloride 20 mEq/100 mL in Sterile Water IVPB 20 mEq, IV, PRN    Or  Potassium Chloride 10 mEq  / Lidocaine 10 mg in 0.9% NaCl IVPB, IV, PRN  Saline Lock Flush 2.5 mL, IV, PRN  Sodium Phosphate 15 mmol in NaCl 0.9% 250 mL IVPB, IV, PRN, Last Rate: Stopped (05/08/18 0458)  Sodium Phosphate 7.5 mmol in NaCl 0.9% 100 mL IVPB, IV, PRN  Sodium Phosphates 22.5 mmol in NaCl 0.9% 250 mL IVPB, IV, PRN          OBJECTIVE:   Vital Signs  Summary  Temp Min: 35.8 C (96.4 F) Max: 37 C (98.6 F)  BP: (109-149)/(71-92)   Pulse Min: 84 Max: 99  Resp Min: 14 Max: 18  SpO2 Min: 96 % Max: 99 %      Current Vitals  Temp: 37 C (98.6 F)  BP: 109/72  Pulse: 99  Resp: 18  SpO2: 97 %  Flow (L/min): 0   Weight: 72.7 kg (160 lb 4.4 oz)     Intake and Output  Last Two Completed Shifts  In: 650 [Oral:650]  Out: 975 [Urine:975]        Physical Exam      General Appearance: healthy, alert, no distress, pleasant affect, cooperative.   HEENT: oral mucosa moist, No JVD, No cervical adenopathy, no nuchal rigidity, right neck wound does not have any redness or tenderness or discharge  Heart: normal rate and regular rhythm, no murmurs, clicks, or gallops.   Lungs: clear to auscultation.   Abdomen: BS normal.  Abdomen soft, non-tender.  No masses or organomegaly.   Extremities: bilateral lower extremity no edema     LINES AND DRAINS: PICC line    LAB TESTS/STUDIES:   I personally reviewed the following labs           Lab Results   Component Value Date    GS Few Polymorphonuclear cells 05/09/2018    GS Few Gram positive cocci 05/09/2018    BCULT No growth after 5 days 05/07/2018            ASSESSMENT & PLAN:          Diabetic ketoacidosis without coma associated with type 1 diabetes mellitus (HCC) (05/07/2018)     Assessment: Sugars are well controlled, DKA resolved     Plan: Continue Lantus and Humalog sliding scale    Abscess of scalp (05/07/2018)     Assessment: Infected epidermoid inclusion cyst on the right cervical spine patient is status post incision and drainage on 05/09/2018     Plan: Wound culture grew out MRSA now she is on vancomycin.  She needs 2 weeks of IV Vanco            DVT Prophylaxis -Lovenox    Disposition -home with outpatient IV antibiotic therapy    Marsalis Beaulieu Burney Gauze, MD,

## 2018-05-13 NOTE — Nurse Focus (Signed)
Dressing change to back of scalp wound done per orders. Moderate amount of purulent drainage noted on old dressing. Pt pre medicated with dilaudid 0.5mg  prior to change. Tolerated well.

## 2018-05-13 NOTE — Allied Health Progress (Addendum)
CASE MANAGEMENT DAILY NOTE    Reviewed Dr. Liborio Nixon note and spoke to [facility] in Pharmacy regarding IV abx duration.  Continued IV abx is suggested for at least 10-14 days.  Patient is day 7.  Asked if patient can be transitiioned to once a day vanco in order to go to OP infusion at [facility] until Vancomycin completed.  Vanco trough will not be available until 4-5 tonight. Family called today to see if patient can go home and if services are arranged through [facility].      Phone call to [facility] Daphne at [place] to discuss OP options for DC home with daily wound care needs and IV abx Q12 or Q24.  Message left.      Rounded with MD.  MD to change IV pain meds to PO.      Phone call to Dr. Liborio Nixon to discuss DC plan either to [facility] if patient is not medically appropriate for DC or DC to OP with [facility] next day FU for coordination of continued WCC/ABX.     Phone call to [facility] to discuss DC needs.  Per Daphne patient is SNF appropriate for continued care with IV abx and wound care.  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      Discussed DC progress with daughter Duwayne Heck [phone]    Patient was given a handout informing them of surrounding skilled nursing facility or home health care choices and their star ratings with contact information? No [facility]    Chart reviewed and Case Management to follow and assist with discharge planning and inpatient management as needed.     Junie Panning, RN     Ext # [phone]  05/13/2018  09:18    CASE MANAGEMENT DAILY NOTE    Rounded with MD.  Patient continues on IV vancomycin for MRSA wound at right posterior scalp epidermoid cyst site..  Followed by Dr. Liborio Nixon.      Discussed with patient.  Patient's husband is disable and has HH to assist him at home.  Patient spouse is unable to assist with packing/wound care for patient.  Discussed possible HH/WCC through [facility]. Daughter can assist on weekends.      Discussed possible DC with IV  abx and need for HH/WCC with [facility].  Daphne at [place] indicated that we would have to arrange IV teaching etc. And this would need to be approved in addition for any HH/WCC needs.  [facility] d[facility] both [facility] and WCC if she is ambulatory. CM to FU with pharmacy regarding anticipated duration of IV abx for MRSA wound.    Discussed with pharmacy.  Pharmacist suggests 7-10 days of vancomycin depending on how wound is progressing.  Patient is diabetic.  CM to FU with new culture results with [id number]  Tomorrow for IV abx duration and surgeon regarding progression of wound and wound care needs at DC.      Dr. Liborio Nixon paged to discuss DC plan.      Fannie Knee in pharmacy indicated that patient may be able to complete IV vanco tomorrow and DC on bactrim PO if surgeon agrees based on healing of wound.  RN updated.      Patient was given a handout informing them of surrounding skilled nursing facility or home health care choices and their star ratings with contact information? No, [facility] patient    Chart reviewed and Case Management to follow and assist with discharge  planning and inpatient management as needed.     Junie Panning, RN     Ext # [phone]  05/12/2018  15:02    CASE MANAGEMENT DAILY NOTE    Rounded with MD.  Patient doing well.  Wound positive for MRSA.  Awaiting surgeon review and recommendations based positive cultures.  Watch for wound care FU through [place] and abx at DC.  CM to continue to assess.      Patient was given a handout informing them of surrounding skilled nursing facility or home health care choices and their star ratings with contact information?  No    Chart reviewed and Case Management to follow and assist with discharge planning and inpatient management as needed.     Junie Panning, RN     Ext # [phone]  05/11/2018  14:02    CASE MANAGEMENT DAILY NOTE      Pt had an ID of scalp abscess- infected sebaceous cyst,  stated likely reason for DKA., stable postoperatively.  CM continuing  to follow,likely no needs, Independent with assistance of spouse.  [facility] was involved in this case however pt has opted to stay at [facility] no further communication with the transfer center.    Patient was given a handout informing them of surrounding skilled nursing facility or home health care choices and their star ratings with contact information?  yes    Chart reviewed and Case Management to follow and assist with discharge planning and inpatient management as needed.     Caryl Asp, RN     Ext # [phone]    CASE MANAGEMENT DAILY NOTE    Received call from Jan at [facility] [phone]. She states that they will not need to transfer patient and she remain here for her care at this time. Patient notified.     Chart reviewed and Case Management to follow and assist with discharge planning and inpatient management as needed.     Jobe Igo, Case Manager     Ext # [phone]  05/08/2018  14:27          CASE MANAGEMENT DAILY NOTE    Spoke with Jan at [facility] [phone] and gave her updated report on the patient.  Spoke with patient who would prefer to stay here if possible.  [facility] notified    Patient was given a handout informing them of surrounding skilled nursing facility or home health care choices and their star ratings with contact information?  no    Chart reviewed and Case Management to follow and assist with discharge planning and inpatient management as needed.     Jobe Igo, Case Manager     Ext # [phone]  05/08/2018  13:08         CASE MANAGEMENT INITIAL PATIENT SCREEN    Patient: Diana Marquez                                    MRN: [id number]  Date of Birth: [birth year] (56yr)                       Gender: female  PCP: Patient, No Pcp Per                             Date of Initial Screen:  05/08/2018 @ 10:58    Type of Contact:  Face to Face    Date of Admission: Inpatient Admission Date/Time: 05/07/2018 4:31 PM      Recent ED or Admission Dates:            Insurance Status: Payor: [facility] /  Plan: [facility]IOR [facility]/MEDICARE RISK / Product Type: *No Product type* /     Is the patient connected to the [facility] or have they ever been in the past?   no    Chief Complaint:  Abdominal Pain; Nausea; and Vomiting      Admitting Diagnosis:   Diabetic ketoacidosis without coma associated with type 1 diabetes mellitus (HCC) [E10.10]  Abscess of scalp [L02.811]  Generalized abdominal pain [R10.84]  Intractable vomiting with nausea, unspecified vomiting type [R11.2]  Dehydration [E86.0]  Acute kidney injury (HCC) [N17.9]     Consults:   GENERAL SURGERY CONSULT    Procedures:     Social Determinants: Pt lives with husband Merton Borderrthur and together they support one another.  There is a walker in the home and husband is currently receiving home health support for wound care to his foot.  Pt has daughter Legrand ComoDaneille who is helpful and available when needed. No pcp listed on face sheet but pt states she [facility]    Home Care active: no       Agency and services:    Readmission Risk Score: Risk of Admission or ED Visit: 2    1 - 19 Green   20 - 39 Yellow   > 40 Red      Case Management Services:    Complete Comprehensive Assessment    no  (.cmcomp)        Chart reviewed and Case Management to follow and assist with discharge planning and inpatient management as needed.      Selinda OrionSheila R Kennedy, RN  Ext [phone]  05/08/2018 @ 10:58  05/09/2018  13:32

## 2018-05-13 NOTE — Allied Health Progress (Signed)
Case Management Assistant Update  05/13/2018  10:53    Update faxed to:  [place]  [facility]: [phone]  FX: [phone]      Clinton Sawyer, Case Manager Assistant     Ext # [phone]  05/13/2018    Case Management Assistant Update  05/12/2018  10:24      Update faxed to:  [facility]  [facility]: [phone]  FX: [phone]    Clinton Sawyer, Case Manager Assistant     Ext # [phone]  05/12/2018    Case Management Assistant Update  05/11/2018  10:37    Update faxed to:  [facility]  [facility]: [phone]  FX: [phone]        Clinton Sawyer, Case Manager Assistant     Ext # [phone]  05/11/2018    Case Management Assistant Update  05/10/2018  13:50  Clinical update faxed to:  [facility]  [facility]: [phone]  FX: [phone]            Haze Boyden, Case Manager Assistant     Ext # [phone]  05/10/2018    Case Management Assistant Update  05/08/2018  10:14    Clinical update faxed to:  [facility]  [facility]: [phone]  FX: [phone]          Clinton Sawyer, Case Manager Assistant     Ext # [phone]  05/08/2018

## 2018-05-13 NOTE — Nurse Focus (Signed)
End of Shift Statement      Patient Progress per diagnosis: VSS, adequate pain control with norco po. Vanco trough 23.9 and vancomycin infusion  changed to q 24. Pt tolerating diet well. Voiding QS. Blood sugars monitored and covered with sliding scale insulin. Wound changed today per orders for a moderate amount of purulent drainage. Pt tolerated well. Up and about in room. Enc to amb in hall      Significant events: Vanco trough = 23.9    Patient is independent in repositioning.  The following items are in use for prevention/treatment heels offloaded and minimal linens.    Patient's pain/comfort level assessed during shift and interventions for increased comfort were implemented as needed.    Non-pharmacological interventions: diversional activity.    Interventions were effective.    Patient was able to participate in ADL's  and rest comfortably  Side Effects: none  The patient was compliant with plan of care.  Education provided on medication vancomycin.    RN Ambulation      Most Recent Value   Ambulation Distance (Feet)  100 Filed at 05/13/2018 1000         Therapy Ambulation      Most Recent Value   Independence Level (Gait)  independent Filed at 05/13/2018 1000            Elie Confer, RN

## 2018-05-13 NOTE — Progress Notes (Signed)
Vancomycin trough today 23.9, supratherapeutic after dose adjustment.  Patient had receieved dose after lab draw.   Will decrease to 1250 mg every 24 hours and recheck trough after 3rd dose.  Next vanco dose due 2/13 at 1700 hours.

## 2018-05-13 NOTE — Progress Notes (Addendum)
GENERAL SURGERY PROGRESS NOTE    Note Date and Time: 05/13/2018 21:00 Date of Admission: 05/07/2018  1:25 PM    Diana Marquez  MRN: [id number]  Patient's PCP: Patient, No Pcp Per      Summary:   Diana Marquez is a 73yr old female who is s/p Procedure(s) with comments:  EXCISION, LESION, SCALP (Right) - excisional biopsy of right posterior scalp infected epidermal inclusion cyst performed 05/09/2018    24hr Interval Events:     - Patient rates pain as dull ache.  Much improved.  Slept well.  - Patient denies nausea/vomiting.    - No subjective fevers, chills, or sweats.    - Tolerating a diet.     Vital Signs   Current Vitals  Temp: 36.1 C (97 F) (05/13/18 1600)  BP: (!) 149/91 (05/13/18 1600) Pulse: 91 (05/13/18 1600)  Resp: 16 (05/13/18 1600)  SpO2: 97 % (05/13/18 1600)      Weight: 72.7 kg (160 lb 4.4 oz) (05/07/18 1845)  24 hour Summary:   Temp Min: 35.9 C (96.7 F) Max: 37 C (98.6 F)  BP: (109-151)/(71-92)   Pulse Min: 84 Max: 99  Resp Min: 14 Max: 18  SpO2 Min: 96 % Max: 97 %  No Data Recorded         I/O:      Intake/Output Summary (Last 24 hours) at 05/13/2018 2102  Last data filed at 05/13/2018 1700  Gross per 24 hour   Intake 1230 ml   Output 1200 ml   Net 30 ml     Physical Exam:  Gen:  NAD, Alert and orientated x 3   Scalp: right scalp mild tenderness, large open, gently packed with packing strips, no granulation tissue. No erythema.  Mild tenderness.     24hr Imaging:   [facility] Ct Abdomen Pelvis W/o Contrast    Result Date: 05/07/2018  [facility] CT ABDOMEN PELVIS WITHOUT CONTRAST INDICATION: 73 year old female with vomiting, abdominal pain. Leukocytosis. COMPARISON: None. TECHNIQUE: Unenhanced CT images of the abdomen and pelvis were obtained and reconstructed in multiple planes. Some images are degraded by motion. FINDINGS: LOWER THORAX:  No pleural or pericardial effusions are identified. The visualized bilateral lung bases appear clear. Small fat-containing eventrations are identified at the  posterior right hemidiaphragm as seen on axial series 3 image 13. CT ABDOMEN: LIVER AND SPLEEN:  No discrete hepatic or splenic abnormality is detected. GALLBLADDER, COMMON BILE DUCT AND PANCREAS:  The gallbladder is within normal limits.  The common bile duct is normal in diameter.  The pancreas is grossly unremarkable accounting for motion artifact. ADRENALS AND KIDNEYS:  The left adrenal is unremarkable.  The right adrenal is unremarkable.  No discrete renal mass lesion, hydronephrosis or nephrolithiasis is seen. AORTA AND RETROPERITONEUM:  Mild scattered atherosclerotic calcifications are identified at the abdominal aorta without aneurysmal dilatation. There is mild flattening of the IVC.  No retroperitoneal mass or adenopathy. STOMACH AND SMALL BOWEL:  The stomach contains a small amount of fluid and gas, otherwise unremarkable.  Unopacified small bowel loops normal in caliber. CT PELVIS: REPRODUCTIVE ORGANS:  The uterus is not definitely appreciated. No discrete adnexal abnormality is identified. URETERS AND URINARY BLADDER:  The urinary bladder within normal limits.  The right ureter is unremarkable.  The left ureter is unremarkable. ADENOPATHY/MASSES:  No pelvic sidewall, obturator nor inguinal adenopathy. RECTUM AND COLON:  There is mild distal colonic diverticulosis without evidence of acute diverticulitis. Small to moderate stool is noted within the  colon. Moderate loose enteric contents are identified within the cecum and ascending colon on coronal image 15. TERMINAL ILEUM AND APPENDIX:  The terminal ileum is within normal limits.  There appears to be a short appendix on coronal image 18, which is otherwise unremarkable.Marland Kitchen VENTRAL WALL:  No umbilical, ventral wall nor inguinal hernias. FREE FLUID/AIR:  No free fluid, no free air. BONES:  No acute osseous abnormality is seen. Prominent posterior facet arthropathy is identified at the lumbar spine. 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 There is  levoconvex curvature of the lumbar spine. Severe discogenic degenerative changes are identified at L1-L2 and L2-L3. There is nonacute-appearing loss of height at the inferior endplate of vertebral body L1 best appreciated on sagittal image 36. There is grade 1 anterolisthesis of L4 on L5, probably degenerative.     IMPRESSION: 1.  No acute intra-abdominal or pelvic abnormality identified. Normal appendix. 2.  Moderate loose enteric contents within the cecum and ascending colon may be secondary to low-grade enteritis. No evidence of bowel obstruction. 3.  Mild distal colonic diverticulosis without evidence of acute diverticulitis. Radiation dose report: CTDIvol 13.18 mGy DLP 676.9 mGy*cm All CT scans at [facility] and [facility] are performed using dose optimization techniques as appropriate to a performed exam including the following: automated exposure control, adjustment of the mA and/or kV according to patient size, and/or use of iterative reconstruction technique.     [facility] Chest 2 Views    Result Date: 05/07/2018  DATE of Exam:  05/07/2018 14:15 INDICATION: Female, age 57 years.  Respiratory distress. COMPARISON: None TECHNIQUE:  2 views of the chest were obtained. FINDINGS:  The lungs are clear. The cardiomediastinal silhouette is within normal limits. No acute osseous abnormality is seen.     IMPRESSION: No acute cardiopulmonary process is identified.       A/P: Diana Marquez is a 73yr old female with severely infected scalp abscess s/p Procedure(s) with comments:  EXCISION, LESION, SCALP (Right) - excisional biopsy of right posterior scalp infected epidermal inclusion cyst performed 05/09/2018    - very infected: continue IV antibiotics, MRSA.    - dressing changes daily, with iodoform packing strips or plain packing strips.    - teach dressing changes to family, or set up home health.  - signing off, please call if questions.     Jerald Kief, MD FACS

## 2018-05-13 NOTE — Plan of Care (Signed)
Problem: Patient Care Overview  Goal: Plan of Care Review  Outcome: Ongoing (interventions implemented as appropriate)  Flowsheets  Taken 05/13/2018 0125 by Lowella Dandy, RN  Plan of Care Reviewed With: patient  Progress: improving  Taken 05/11/2018 1252 by Lilia Pro, RN  Outcome Summary: see eoss  Goal: Individualization and Mutuality  Outcome: Ongoing (interventions implemented as appropriate)  Flowsheets (Taken 05/07/2018 1955 by Neill Loft, RN)  What Information Would Help [place] Give You More Personalized Care?: I can't think of anything.  What Anxieties, Fears, Concerns, or Questions Do You Have About Your Care?: Just worried about my husband because he uses a walker to get to the bathroom and needs help cleaning himself. Her brother is going to stay with him tonight.  Patient Specific Goals (Include Timeframe): Wants to get better quickly so she can go home and take care of her husband. She is his caregiver.  Patient Specific Interventions: Nothing really  How to Address Anxieties/Fears: You can't really do anything from here.  How Would You and/or Your Support Person Like to Participate in Your Care?: Just keep me up to date on the tx plan  Goal: Discharge Needs Assessment  Outcome: Ongoing (interventions implemented as appropriate)  Goal: Interprofessional Rounds/Family Conf  Outcome: Ongoing (interventions implemented as appropriate)

## 2018-05-13 NOTE — Nurse Focus (Signed)
End of Shift Statement      Patient Progress per diagnosis: BP 146/92 at 0350 but grimacing w posterior scalp pain at that time, otherwise VSS. Afebrile. Able to sleep well per pt. Norco x1 and Dilaudid x1 effective for pain. Remains on contact precautions. Up at lib in room w/o difficulty. FSBS 155 at HS and 70 this am - 1 [place] juice given.    Significant events: None    Patient is independent in repositioning.  The following items are in use for prevention/treatment minimal linens.    Patient's pain/comfort level assessed during shift and interventions for increased comfort were implemented as needed.    Non-pharmacological interventions: diversional activity, repositioning and pillow support.    Interventions were effective.    Patient was able to participate in ADL's  and rest comfortably  Side Effects: none  The patient was compliant with plan of care.  Education provided on medication Dilaudid, Levothyroxine.Marland Kitchen    RN Ambulation      Most Recent Value   Ambulation Distance (Feet)  300 Filed at 05/12/2018 1500         Therapy Ambulation      Most Recent Value   Independence Level (Gait)  independent Filed at 05/08/2018 0700        Lowella Dandy, RN

## 2018-05-14 LAB — POC GLUCOSE
POC GLUCOSE: 77 mg/dL (ref 70–99)
POC GLUCOSE: 93 mg/dL (ref 70–99)

## 2018-05-14 LAB — CBC NO DIFFERENTIAL
HEMATOCRIT: 33.8 % — AB (ref 36.0–48.0)
HEMOGLOBIN: 11.6 g/dL — AB (ref 12.0–16.0)
MCH: 31.8 pg (ref 27.0–34.0)
MCHC G/DL: 34.3 g/dL (ref 33.0–37.0)
MCV: 92.6 fL (ref 82.0–97.0)
MPV: 8.2 fL — AB (ref 9.4–12.4)
NUCLEATED CELL COUNT: 0 10*3/uL (ref 0.0–0.1)
NUCLEATED RBC/100 WBC: 0 %{WBCs} (ref <=0.0)
PLATELET COUNT: 347 10*3/uL (ref 151–365)
RDW: 13.7 % (ref 11.5–14.5)
RED CELL COUNT: 3.65 10*6/uL — AB (ref 3.80–5.10)
WHITE BLOOD CELL COUNT: 7.7 10*3/uL (ref 4.2–10.8)

## 2018-05-14 MED ORDER — DULOXETINE 20 MG CAPSULE,DELAYED RELEASE
60.00 mg | DELAYED_RELEASE_CAPSULE | Freq: Every day | ORAL | 1 refills | Status: AC
Start: 2018-05-21 — End: 2019-12-13

## 2018-05-14 MED ORDER — LINEZOLID 600 MG TABLET
600.00 mg | ORAL_TABLET | Freq: Two times a day (BID) | ORAL | 0 refills | Status: DC
Start: 2018-05-14 — End: 2018-05-14

## 2018-05-14 MED ORDER — LINEZOLID 600 MG TABLET
600.00 mg | ORAL_TABLET | Freq: Two times a day (BID) | ORAL | 0 refills | Status: AC
Start: 2018-05-14 — End: 2018-05-21

## 2018-05-14 NOTE — Plan of Care (Signed)
Problem: Patient Care Overview  Goal: Plan of Care Review  Outcome: Ongoing (interventions implemented as appropriate)   INITIAL NUTRITION ASSESSMENT    Reason For Assessment: length of stay    Nutrition Assessment     73yr old female admitted with DKA  Hx:   Past Medical History:   Diagnosis Date    Bell's palsy     Depression     Diabetes (HCC)     GERD (gastroesophageal reflux disease)     HTN (hypertension)     Hypercholesteremia     Hypothyroid     OSA (obstructive sleep apnea)      Summary:  Good appetite. No complaints.   63 YOF with severely infected scalp abscess, on IV abx, MRSA. Poor glycemic control d/t infection. On Lantus and s/s insulin.     Food Allergies: No Known Allergies    Height: 5\' 2"   Admit Weight 72.7 kg (actual, 2/6)  Current Weight: no new wt   BMI: Body mass index is 29.31 kg/m.  BMI Classification: overweight  IBW: 50 kg    Wt History: stable  Wt Readings from Last 20 Encounters:   05/07/18 72.7 kg (160 lb 4.4 oz)     Last BM: 2/12   Skin: No acute pressure related injuries noted  Pertinent Labs: POC glu 77-286; Glu 301; Na 132  Pertinent Meds: 27 U Lantus, s/s insulin, Culturelle    Estimated Nutrition Needs: (based on IBW, 50 kg):  1250-1500 kcals/day (25-30 kcals/kg)  50-60 g pro/day (1-1.2 g pro/kg)  1250-1500 mL fluid/day (1 mL/kcal)    Diet Order: CARBOHYDRATE CONTROLLED (DIABETIC) DIET    Average PO Intake: 75%    Nutrition Diagnosis     No active acute nutrition diagnosis at this time.    Nutrition Intervention/Recommendations     1) Continue current diet as per MD orders.    Nutrition Monitoring/Goals:      Monitor: oral intake  Nutrition Focused Goal: adequate nutrition    Re-assessment Date: 2/20     Burna Sis, RD

## 2018-05-14 NOTE — Plan of Care (Signed)
Problem: Patient Care Overview  Goal: Plan of Care Review  Outcome: Ongoing (interventions implemented as appropriate)  Flowsheets  Taken 05/14/2018 0136 by Lowella Dandy, RN  Plan of Care Reviewed With: patient  Progress: improving  Taken 05/11/2018 1252 by Lilia Pro, RN  Outcome Summary: see eoss  Goal: Individualization and Mutuality  Outcome: Ongoing (interventions implemented as appropriate)  Flowsheets (Taken 05/07/2018 1955 by Neill Loft, RN)  What Information Would Help [place] Give You More Personalized Care?: I can't think of anything.  What Anxieties, Fears, Concerns, or Questions Do You Have About Your Care?: Just worried about my husband because he uses a walker to get to the bathroom and needs help cleaning himself. Her brother is going to stay with him tonight.  Patient Specific Goals (Include Timeframe): Wants to get better quickly so she can go home and take care of her husband. She is his caregiver.  Patient Specific Interventions: Nothing really  How to Address Anxieties/Fears: You can't really do anything from here.  How Would You and/or Your Support Person Like to Participate in Your Care?: Just keep me up to date on the tx plan  Goal: Discharge Needs Assessment  Outcome: Ongoing (interventions implemented as appropriate)  Goal: Interprofessional Rounds/Family Conf  Outcome: Ongoing (interventions implemented as appropriate)

## 2018-05-14 NOTE — Allied Health Progress (Signed)
Case Management Assistant Update  05/14/2018  13:19    Update faxed to:  [facility]  [facility]: [phone]  FX: [phone]    Faxed Wound care order to [facility], Case Manager Assistant     Ext # [phone]  05/14/2018    Case Management Assistant Update  05/13/2018  10:53    Update faxed to:  [facility]  [facility] [phone]  FX: [phone]      Clinton Sawyer, Case Manager Assistant     Ext # [phone]  05/13/2018    Case Management Assistant Update  05/12/2018  10:24      Update faxed to:  [facility]  [facility]: [phone]  FX: [phone]    Clinton Sawyer, Case Manager Assistant     Ext # [phone]  05/12/2018    Case Management Assistant Update  05/11/2018  10:37    Update faxed to:  [facility]  [facility]: [phone]  FX: [phone]        Clinton Sawyer, Case Manager Assistant     Ext # [phone]  05/11/2018    Case Management Assistant Update  05/10/2018  13:50  Clinical update faxed to:  [facility]  [facility]: [phone]  FX: [phone]            Haze Boyden, Case Manager Assistant     Ext # [phone]  05/10/2018    Case Management Assistant Update  05/08/2018  10:14    Clinical update faxed to:  [facility]  [facility]: [phone]  FX: [phone]          Clinton Sawyer, Case Manager Assistant     Ext # [phone]  05/08/2018

## 2018-05-14 NOTE — Plan of Care (Signed)
Problem: Patient Care Overview  Goal: Plan of Care Review  Flowsheets  Taken 05/14/2018 0859 by Ananias Pilgrim, RN  Plan of Care Reviewed With: patient  Taken 05/14/2018 0136 by Lowella Dandy, RN  Progress: improving  Taken 05/11/2018 1252 by Lilia Pro, RN  Outcome Summary: see eoss  Goal: Individualization and Mutuality  Flowsheets (Taken 05/07/2018 1955 by Neill Loft, RN)  What Information Would Help [place] Give You More Personalized Care?: I can't think of anything.  What Anxieties, Fears, Concerns, or Questions Do You Have About Your Care?: Just worried about my husband because he uses a walker to get to the bathroom and needs help cleaning himself. Her brother is going to stay with him tonight.  Patient Specific Goals (Include Timeframe): Wants to get better quickly so she can go home and take care of her husband. She is his caregiver.  Patient Specific Interventions: Nothing really  How to Address Anxieties/Fears: You can't really do anything from here.  How Would You and/or Your Support Person Like to Participate in Your Care?: Just keep me up to date on the tx plan  Goal: Discharge Needs Assessment  Flowsheets (Taken 05/07/2018 1902 by Neill Loft, RN)  Transportation Anticipated: family or friend will provide  Patient/Family Anticipated Services at Transition: none  Patient/Family Anticipates Transition to: home  Goal: Interprofessional Rounds/Family Conf  Flowsheets (Taken 05/09/2018 1638 by Leeanne Rio, RN)  Participants:   nursing   patient     Problem: Skin Injury Risk (Adult)  Goal: Identify Related Risk Factors and Signs and Symptoms  Description: Related risk factors and signs and symptoms are identified upon initiation of Human Response Clinical Practice Guideline (CPG).  Flowsheets (Taken 05/10/2018 4665 by Leeanne Rio, RN)  Related Risk Factors (Skin Injury Risk):   infection   tissue perfusion altered  Goal: Skin Health and Integrity  Description: Patient will demonstrate the  desired outcomes by discharge/transition of care.  Flowsheets (Taken 05/14/2018 1041)  Skin Health and Integrity: making progress toward outcome     Problem: Diabetes, Type 1 (Adult)  Goal: Signs and Symptoms of Listed Potential Problems Will be Absent, Minimized or Managed (Diabetes, Type 1)  Description: Signs and symptoms of listed potential problems will be absent, minimized or managed by discharge/transition of care (reference Diabetes, Type 1 (Adult) CPG).  Flowsheets (Taken 05/10/2018 9935 by Leeanne Rio, RN)  Problems Assessed (Type 1 Diabetes): all  Problems Present (Type 1 Diabetes): hyperglycemia     Problem: Fall Risk (Adult)  Goal: Absence of Fall  Description: Patient will demonstrate the desired outcomes by discharge/transition of care.  Flowsheets (Taken 05/14/2018 1041)  Absence of Fall: making progress toward outcome     Problem: Pain, Acute (Adult)  Goal: Identify Related Risk Factors and Signs and Symptoms  Description: Related risk factors and signs and symptoms are identified upon initiation of Human Response Clinical Practice Guideline (CPG).  Flowsheets (Taken 05/14/2018 1041)  Related Risk Factors (Acute Pain): surgery  Goal: Acceptable Pain Control/Comfort Level  Description: Patient will demonstrate the desired outcomes by discharge/transition of care.  Flowsheets (Taken 05/14/2018 1041)  Acceptable Pain Control/Comfort Level: making progress toward outcome     Problem: Infection, Risk/Actual (Adult)  Goal: Identify Related Risk Factors and Signs and Symptoms  Description: Related risk factors and signs and symptoms are identified upon initiation of Human Response Clinical Practice Guideline (CPG).  Flowsheets (Taken 05/14/2018 1041)  Related Risk Factors (Infection, Risk/Actual): skin integrity impairment  Goal: Infection Prevention/Resolution  Description: Patient will demonstrate the desired outcomes by discharge/transition of care.  Flowsheets (Taken 05/14/2018 1041)  Infection  Prevention/Resolution: making progress toward outcome     Problem: Renal Failure/Kidney Injury, Acute (Adult)  Goal: Signs and Symptoms of Listed Potential Problems Will be Absent, Minimized or Managed (Renal Failure/Kidney Injury, Acute)  Description: Signs and symptoms of listed potential problems will be absent, minimized or managed by discharge/transition of care (reference Renal Failure/Kidney Injury, Acute (Adult) CPG).  Flowsheets (Taken 05/14/2018 1041)  Problems Assessed (Acute Renal Failure/Kidney Injury): electrolyte imbalance     Problem: Nausea/Vomiting (Adult)  Goal: Identify Related Risk Factors and Signs and Symptoms  Description: Related risk factors and signs and symptoms are identified upon initiation of Human Response Clinical Practice Guideline (CPG).  Flowsheets (Taken 05/14/2018 1041)  Related Risk Factors (Nausea/Vomiting): metabolic abnormalities  Goal: Symptom Relief  Description: Patient will demonstrate the desired outcomes by discharge/transition of care.  Flowsheets (Taken 05/14/2018 1041)  Symptom Relief: making progress toward outcome  Goal: Adequate Hydration  Description: Patient will demonstrate the desired outcomes by discharge/transition of care.  Flowsheets (Taken 05/14/2018 1041)  Adequate Hydration: making progress toward outcome

## 2018-05-14 NOTE — Nurse Discharge Note (Signed)
Instructed to follow up with MD and wound care at [facility] tomorrow, appointment scheduled. Teaching regarding wound care given. Escorted to car by staff. Denies any needs.

## 2018-05-14 NOTE — Nurse Focus (Signed)
End of Shift Statement      Patient Progress per diagnosis: BP 145/87-158/85, otherwise VSS. Afebrile. Norco effective for posterior R lower scalp pain (site of I&D) given x1 tonight -declined this am. Up ad lib in room - tol well. States slept well. FSBS 286 at HS  77 this am - sm Barnhart juice given.    Significant events: None    Patient is independent in repositioning.  The following items are in use for prevention/treatment minimal linens.    Patient's pain/comfort level assessed during shift and interventions for increased comfort were implemented as needed.    Non-pharmacological interventions: diversional activity, repositioning and pillow support.    Interventions were effective.    Patient was able to participate in ADL's , rest comfortably and increase mobility  Side Effects: none  The patient was compliant with plan of care.  Education provided on medication Lipitor, Pepcid and Norco..    RN Ambulation      Most Recent Value   Ambulation Distance (Feet)  100 Filed at 05/13/2018 1000         Therapy Ambulation      Most Recent Value   Independence Level (Gait)  independent Filed at 05/13/2018 1000        Lowella Dandy, RN

## 2018-05-14 NOTE — Allied Health Progress (Addendum)
CASE MANAGEMENT TRANSITION/DISCHARGE  NOTE    Name: Diana Marquez  MRN: [id number]   Date of Birth:06-25-1945 (9yr) Gender:female    Note Date: 05/14/2018 Note Time: 15:51   PCP: Patient, No Pcp Per        Patient home on po antibiotic Zyvox.  [facility] PCP called patient and set up an appointment for tomorrow to do wound dressing, they will teach patient and her brother on how to do dressing due to clinic closed on weekends and this coming Monday.  Patient will again have an appointment with her PCP on Tuesday.  Patient comfortable with this discharge plan and is looking forward to going home.  Patient is independent and drives.  Daphne at [facility] outside services aware of discharge plan.      Patient was given a handout informing them of surrounding skilled nursing facility or home health care choices and their star ratings with contact information?  no    Chart reviewed and Case Management to follow and assist with discharge planning and inpatient management as needed.     Terese Door, RN     Ext # [phone]  05/14/2018  15:48

## 2018-05-14 NOTE — Discharge Summary (Addendum)
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  No acute intra-abdominal or pelvic abnormality identified. Normal appendix. 2.  Moderate loose enteric contents within the cecum and ascending colon may be secondary to low-grade enteritis. No evidence of bowel obstruction. 3.  Mild distal colonic diverticulosis without evidence of acute diverticulitis. Radiation dose report: CTDIvol 13.18 mGy DLP 676.9 mGy*cm All CT scans at [facility] are performed using dose optimization techniques as appropriate to a performed exam including the following: automated exposure control, adjustment of the mA and/or kV according to patient size, and/or use of iterative reconstruction technique.     [facility] Chest 2 Views    Result Date: 05/07/2018      IMPRESSION: No acute cardiopulmonary process is identified.       Reason for Admission:   Abdominal pain ,nausea and vomiting    Hospital Course:  #1 diabetic ketoacidosis without coma  Diana Marquez is a 73 year old female with known history of type 1 diabetes, hypertension, depression, chronic pain who has not been compliant with her insulin.  She was also found to have a right occipital cervical abscess which her primary care try to do an I&D in the clinic in [facility].  Next day patient ended up in the [facility] ED with abdominal pain nausea vomiting and DKA.  She was admitted to the ICU and treated with insulin drip now DKA has resolved her blood sugars are well controlled.  Patient apparently was not taking her insulin and Lantus because of cost issues.  This was counseled and discussed with the patient patient now agreeable to taking her full dose of insulin as recommended.  And also follow the diet.    #2 right posterior scalp occipital abscess  Patient was found to have a epidermal inclusion cyst on the posterior aspect of the right part of the scalp.  She was seen by Dr. Liborio Nixon underwent incision and drainage.  Patient's wound culture grew out MRSA she was given 7 days of IV vancomycin.  Patient only needs oral antibiotics and local wound care.  She has been cleared by the surgeon to be discharged home.  She is being discharged home on 7 days of Zyvox.  And local wound care which will be set up by the case manager outpatient basis.    #3 hypertension  We will continue atenolol at home.    #4 depression  Patient will continue with Cymbalta 30 mg once a day.  She had knows that there is an interaction with Zyvox serotonin syndrome.  While she is on Zyvox she can hold her Cymbalta.    Objective:    Vital Signs:  Current Vitals  Temp: (!) 35.7  C (96.3 F)  BP: 160/89  Pulse: 84  Resp: 18  SpO2: 95 %  Flow (L/min): 0   Weight: 72.7 kg (160 lb 4.4 oz)       Physical Exam    GENERAL: Well appearing, no acute distress, alert, oriented x 3  HEENT: Normocephalic,atraumatic, no scleral icterus on conjunctival injection, right posterior occipital area patient has an open wound that looks like it is healing well without any discharge or redness or drainage.  NECK: Supple, no rigidity. No lymphadenopathy, bruits or thyromegaly.  LUNGS: Clear to auscultation bilaterally. No crackles or wheeze.  CV: RRR, normal S1/S2, no gallops. PMI not displaced. No jugular  venous distention.  GI: Soft, nontender, normoactive bowel sounds.  GU: No suprapubic or flank tenderness.  BACK: No spinal or paraspinal tenderness.  EXT: No cyanosis, clubbing or distal pitting edema, well perfused.  SKIN: Warm and dry, no ulcerations.  Neuro: Cranial nerves 2-12 grossly intact, no new gross motor deficits,      LABS:  Lab Results - 24 hours (excluding micro and POC)   VANCOMYCIN, TROUGH     Status: Abnormal   Result Value Status    Vancomycin, Trough 23.9 (H) Final   POC GLUCOSE     Status: Abnormal   Result Value Status    POC GLUCOSE 163 (Abnl) Final    POC GLUCOSE MACHINE ID      POC GLUCOSE STRIP LOT#     POC GLUCOSE     Status: Abnormal   Result Value Status    POC GLUCOSE 286 (Abnl) Final    POC GLUCOSE MACHINE ID      POC GLUCOSE STRIP LOT#     POC GLUCOSE     Status: None   Result Value Status    POC GLUCOSE 77 Final    POC GLUCOSE MACHINE ID      POC GLUCOSE STRIP LOT#     POC GLUCOSE     Status: None   Result Value Status    POC GLUCOSE 93 Final    POC GLUCOSE MACHINE ID      POC GLUCOSE STRIP LOT#         Condition on Discharge:     CONDITION AT DISCHARGE     Condition at Discharge: Stable           Pending Studies:  None    Discharge Plan:  Home with  wound care      Discharge Medications    Medications to Continue:     Medication List      START taking these medications    Linezolid 600 mg Tablet  Commonly known as:  ZYVOX  Take 1 tablet by mouth 2 times daily for 7 days. avoid tyramine-conraining foods/beverages        CHANGE how you take these medications    Duloxetine 20 mg Delayed Release Capsule  Commonly known as:  CYMBALTA  Take 3 capsules by mouth every day.  Start taking on:  May 21, 2018  What changed:  These instructions start on May 21, 2018. If you are unsure what to do until then, ask your doctor or other care provider.        CONTINUE taking these medications    Aspirin 81 mg EC Tablet  Take 81 mg by mouth every morning.     Atenolol 50 mg Tablet  Commonly  known as:  TENORMIN  Take 50 mg by mouth every day.     Atorvastatin  40 mg tablet  Commonly known as:  LIPITOR  Take 40 mg by mouth every day at bedtime.     HumaLOG U-100 Insulin 100 unit/mL Vial  Generic drug:  Insulin Lispro  Indications: type 1 diabetes mellitus Per calculated meal doses + sliding scale     LANTUS U-100 INSULIN 100 unit/mL Vial  Generic drug:  Insulin Glargine  Inject 27 Units subcutaneously every day at bedtime.     LevoTHYROxine 100 mcg Tablet  Take 100 mcg by mouth every morning before a meal.     NORCO Tablet  Generic drug:  Hydrocodone 5 mg/Acetaminophen 325 mg  Take 1 tablet by mouth every 12 hours.     PEPCID 20 mg Tablet  Generic drug:  FamoTIDine  Take 20 mg by mouth 2 times daily.        STOP taking these medications    BACTRIM DS Tablet  Generic drug:  Trimethoprim 160 mg/Sulfamethoxazole 800 mg     Mupirocin 2 % Ointment  Commonly known as:  BACTROBAN           Where to Get Your Medications      These medications were sent to [facility] - [place], [place] - [address] [phone] [facility] [phone] FX  [address] [place], [place] [address]    Phone:  [phone]    Duloxetine 20 mg Delayed Release Capsule   Linezolid 600 mg Tablet           Discharge Diet:  Cardiac and diabetic    Discharge Activity:  As tolerated    Total time spent on discharge is over 31 minutes    Recommended Follow Up Appointments:    No follow-up provider specified.

## 2018-05-15 ENCOUNTER — Telehealth: Payer: Self-pay

## 2018-08-21 LAB — HAPPY TOGETHER UNMAPPED RESULTS: COVID-19 Results (Outside Organization): NOT DETECTED — NL

## 2019-08-14 LAB — HAPPY TOGETHER UNMAPPED RESULTS: COVID-19 Results (Outside Organization): NOT DETECTED — NL

## 2019-12-13 ENCOUNTER — Emergency Department
Admission: EM | Admit: 2019-12-13 | Discharge: 2019-12-13 | Disposition: A | Discharge status: Home / Self Care | Payer: Medicare (Managed Care) | Attending: Emergency Medicine | Admitting: Emergency Medicine

## 2019-12-13 DIAGNOSIS — E11649 Type 2 diabetes mellitus with hypoglycemia without coma: Secondary | ICD-10-CM | POA: Insufficient documentation

## 2019-12-13 DIAGNOSIS — E162 Hypoglycemia, unspecified: Secondary | ICD-10-CM | POA: Insufficient documentation

## 2019-12-13 DIAGNOSIS — Z794 Long term (current) use of insulin: Secondary | ICD-10-CM | POA: Insufficient documentation

## 2019-12-13 LAB — COMPREHENSIVE METABOLIC PANEL
Adjusted Calcium: 8.8 mg/dL (ref 8.7–10.2)
Alanine Transferase (ALT): 20 U/L (ref 4–56)
Alb/Glob Ratio: 1.3 (ref 1.0–1.6)
Albumin: 4.5 g/dL (ref 3.2–4.7)
Alkaline Phosphatase (ALP): 74 U/L (ref 38–126)
Aspartate Transaminase (AST): 29 U/L (ref 9–44)
BUN/ Creatinine: 33.7 — ABNORMAL HIGH (ref 7.3–21.7)
Bilirubin Total: 0.5 mg/dL (ref 0.1–2.2)
Calcium: 9.2 mg/dL (ref 8.7–10.2)
Carbon Dioxide Total: 25 mmol/L (ref 22–32)
Chloride: 101 mmol/L (ref 99–109)
Creatinine Serum: 1.04 mg/dL (ref 0.50–1.30)
E-GFR, Non-African American: 53 mL/min/{1.73_m2} — ABNORMAL LOW (ref >60)
E-GFR: 61 mL/min/{1.73_m2} (ref >60)
Globulin: 3.5 g/dL (ref 2.2–4.2)
Glucose: 114 mg/dL — ABNORMAL HIGH (ref 70–99)
Potassium: 3.9 mmol/L (ref 3.5–5.2)
Protein: 8 g/dL (ref 5.9–8.2)
Sodium: 137 mmol/L (ref 134–143)
Urea Nitrogen, Blood (BUN): 35 mg/dL — ABNORMAL HIGH (ref 6–21)

## 2019-12-13 LAB — CBC WITH DIFFERENTIAL
Basophils % Auto: 0.6 % (ref 0.0–1.0)
Basophils Abs Auto: 0.1 10*3/uL (ref 0.0–0.1)
Eosinophils % Auto: 1.1 % (ref 0.0–4.0)
Eosinophils Abs Auto: 0.2 10*3/uL (ref 0.0–0.2)
Hematocrit: 41.7 % (ref 36.0–48.0)
Hemoglobin: 14.3 g/dL (ref 12.0–16.0)
Immature Granulocytes % Auto: 0.9 % — ABNORMAL HIGH (ref 0.00–0.50)
Immature Granulocytes Abs Auto: 0.1 10*3/uL — ABNORMAL HIGH (ref 0.0–0.0)
Lymphocytes % Auto: 15.5 % (ref 5.0–41.0)
Lymphocytes Abs Auto: 2.4 10*3/uL (ref 1.3–2.9)
MCH: 32.5 pg (ref 27.0–34.0)
MCHC g/dL: 34.3 g/dL (ref 33.0–37.0)
MCV: 94.8 fL (ref 82.0–97.0)
MPV: 8.3 fL — ABNORMAL LOW (ref 9.4–12.4)
Monocytes % Auto: 4.7 % (ref 0.0–10.0)
Monocytes Abs Auto: 0.7 10*3/uL (ref 0.3–0.8)
Neutrophils % Auto: 77.2 % — ABNORMAL HIGH (ref 45.0–75.0)
Neutrophils Abs Auto: 11.71 10*3/uL — ABNORMAL HIGH (ref 2.20–4.80)
Nucleated Cell Count: 0 10*3/uL (ref 0.0–0.1)
Nucleated RBC/100 WBC: 0 % WBC (ref <=0.0)
Platelet Count: 309 10*3/uL (ref 151–365)
RDW: 13 % (ref 11.5–14.5)
Red Blood Cell Count: 4.4 10*6/uL (ref 3.80–5.10)
White Blood Cell Count: 15.2 10*3/uL — ABNORMAL HIGH (ref 4.2–10.8)

## 2019-12-13 LAB — POC GLUCOSE
POC GLUCOSE: 118 mg/dL — AB (ref 70–99)
POC GLUCOSE: 54 mg/dL — AB (ref 70–99)

## 2019-12-13 LAB — TROPONIN I: Troponin I_MMC: <0.01 ng/mL (ref <=0.03)

## 2019-12-13 MED ORDER — ONDANSETRON HCL (PF) 4 MG/2 ML INJECTION SOLUTION
4.00 mg | Freq: Once | INTRAMUSCULAR | Status: AC
Start: 2019-12-13 — End: 2019-12-13
  Administered 2019-12-13: 4 mg via INTRAVENOUS
  Filled 2019-12-13: qty 2

## 2019-12-13 MED ORDER — NACL 0.9% IV BOLUS - DURATION REQ
500.00 mL | Freq: Once | INTRAVENOUS | Status: AC
Start: 2019-12-13 — End: 2019-12-13
  Administered 2019-12-13: 500 mL via INTRAVENOUS

## 2019-12-13 NOTE — ED Nursing Note (Signed)
Pt reports mild nausea. MD notified. New orders pending.

## 2019-12-13 NOTE — ED Triage Note (Signed)
Low blood sugars. IDDM. Last FSBS at was "low". 54 in triage. Nauseated. "I don't feel good".

## 2019-12-13 NOTE — ED Nursing Note (Signed)
Patient discharged in stable condition, in NAD w/VSS. Patient given discharge instructions verbally and on paper. Pt verbalizes the need to follow up w/PCP within 1-2 days.  Pt denies questions or concerns at this time. Patient verbalized understanding and need to return to ED for any new or worsening s/s such as but not limited to hypoglycemia, ALOC. Pt encouraged to drink plenty of clear liquids. All belongings sent with patient. Pt left ambulatory w/female attendee.

## 2019-12-13 NOTE — ED Provider Notes (Signed)
DOS: 12/13/2019         Primary Care Provider: Patient, No Pcp Per    Chief Complaint   Patient presents with    Other     hypoglycemia     The history provided by the patient and medical records.    This is a 74yr female, with history of Bell's palsy, depression, diabetes, reflux, hypertension, diabetes on insulin, who presents to the ED with a chief complaint of hypoglycemia.  Patient self presenting for low blood sugar.  She reports that she ran forgot to eat lunch and was out all day running errands.  She reports she was feeling very anxious, had some numbness and tingling in her hands.      Past Medical History:   Diagnosis Date    Bell's palsy     Depression     Diabetes (HCC)     GERD (gastroesophageal reflux disease)     HTN (hypertension)     Hypercholesteremia     Hypothyroid     OSA (obstructive sleep apnea)        Past Surgical History:   Procedure Laterality Date    HYSTERECTOMY         Social History     Tobacco Use    Smoking status: Never Smoker    Smokeless tobacco: Never Used   Substance Use Topics    Alcohol use: Yes     Alcohol/week: 1.0 standard drink     Types: 1 Glasses of wine per week       Physical Exam  Vitals and nursing note reviewed.   Constitutional:       Appearance: Normal appearance.   HENT:      Head: Normocephalic and atraumatic.   Eyes:      Extraocular Movements: Extraocular movements intact.      Conjunctiva/sclera: Conjunctivae normal.      Pupils: Pupils are equal, round, and reactive to light.   Cardiovascular:      Rate and Rhythm: Normal rate.      Pulses: Normal pulses.   Pulmonary:      Effort: Pulmonary effort is normal. No respiratory distress.      Breath sounds: Normal breath sounds.   Abdominal:      General: Abdomen is flat.      Tenderness: There is no abdominal tenderness.   Musculoskeletal:         General: Normal range of motion.      Cervical back: Normal range of motion.   Skin:     General: Skin is warm.      Capillary Refill: Capillary refill  takes less than 2 seconds.   Neurological:      Mental Status: She is alert and oriented to person, place, and time.      Sensory: No sensory deficit.      Motor: No weakness.      Gait: Gait normal.   Psychiatric:         Mood and Affect: Mood normal.         ROS: Positives and pertinent negatives as per HPI. All other systems were reviewed and are negative.      ED Triage Vitals [12/13/19 1732]   Enc Vitals Group      BP (!) 181/96      Pulse 66      Resp 24      Temp       Temp src       SpO2 92 %  Weight 65.8 kg (145 lb)      Height 1.575 m (5\' 2" )      Head Circumference       Peak Flow       Pain Score       Pain Loc       Pain Edu?       Excl. in GC?        INITIAL ASSESSMENT & PLAN    Patient is a 74yr female with a history significant for diabetes, hypothyroidism, high cholesterol, hypertension, reflux who present to the ED with hyperglycemia.     Initial vital signs, hypertensive    Differentials on this patient include but are not limited too: Probable secondary to decreased p.o. intake, doubt sepsis, ACS or any stress-induced cause    MDM: Otherwise well-appearing 74 year old female present with hypoglycemia.  She reports from getting lunch today and was running errands all day.  Denies any complaints other than some numbness and tingling hands and some mild shortness of breath which he attributes to anxiety.  No chest pain.  Vitals otherwise unremarkable on arrival.  Initial blood sugar was 54 and she was given some [place] juice and something to eat.  Basic labs were obtained, mild leukocytosis 15,000, likely stress related with no infectious symptoms otherwise normal chemistry.  Repeat blood sugar was 118.    Patient was reassessed, all of her symptoms have resolved, feels better, declined to give a urine sample for evaluation of UTI.  Recommend close follow-up with PCP.      Results of Testing    Lab work Obtained:   Results for orders placed or performed during the hospital encounter of 12/13/19    CBC with Differential     Status: Abnormal   Result Value Status    White Blood Cell Count 15.2 (H) Final    Red Blood Cell Count 4.40 Final    Hemoglobin 14.3 Final    Hematocrit 41.7 Final    MCV 94.8 Final    MCH 32.5 Final    MCHC g/dL 12/15/19 Final    RDW 97.4 Final    MPV 8.3 (L) Final    Platelet Count 309 Final    Neutrophils % Auto 77.2 (H) Final    Lymphocytes % Auto 15.5 Final    Monocytes % Auto 4.7 Final    Eosinophils % Auto 1.1 Final    Basophils % Auto 0.6 Final    Immature Granulocytes % Auto 0.90 (H) Final    Neutrophils Abs Auto 11.71 (H) Final    Lymphocytes Abs Auto 2.4 Final    Monocytes Abs Auto 0.7 Final    Eosinophils Abs Auto 0.2 Final    Basophils Abs Auto 0.1 Final    Nucleated RBC/100 WBC 0.0 Final    Nucleated Cell Count 0.0 Final    Immature Granulocytes Abs Auto 0.1 (H) Final   Comprehensive Metabolic Panel     Status: Abnormal   Result Value Status    Sodium 137 Final    Potassium 3.9 Final    Chloride 101 Final    Carbon Dioxide Total 25 Final    Urea Nitrogen, Blood (BUN) 35 (H) Final    Creatinine Serum 1.04 Final    BUN/ Creatinine 33.7 (H) Final    Glucose 114 (H) Final    Calcium 9.2 Final    Adjusted Calcium 8.8 Final    Protein 8.0 Final    Albumin 4.5 Final    Alkaline Phosphatase (ALP) 74  Final    Aspartate Transaminase (AST) 29 Final    Bilirubin Total 0.5 Final    Alanine Transferase (ALT) 20 Final    Globulin 3.5 Final    Alb/Glob Ratio 1.3 Final    E-GFR 61 Final    E-GFR, Non-African American 53 (L) Final   Troponin I     Status: Normal   Result Value Status    Troponin [facility] <0.01 Final   POC GLUCOSE ONCE     Status: Abnormal   Result Value Status    POC GLUCOSE 54 (Abnl) Final    POC GLUCOSE MACHINE ID      POC GLUCOSE STRIP LOT#     POC GLUCOSE ONCE     Status: Abnormal   Result Value Status    POC GLUCOSE 118 (Abnl) Final    POC GLUCOSE MACHINE ID      POC GLUCOSE STRIP LOT#         Medications given:   Medications   NaCl 0.9% Bolus 500 mL (500 mL IV New bag/syringe  12/13/19 1751)   Ondansetron (ZOFRAN) Injection 4 mg (4 mg IV Given 12/13/19 1813)       Radiology Results:    None      LATEST VITAL SIGNS  Temp: --  Temp src: --  Pulse: 66 (09/13 1732)  BP: 181/96 (09/13 1732)  Resp: 24 (09/13 1732)  SpO2: 92 % (09/13 1732)  Height: 157.5 cm (5\' 2" ) (09/13 1732)  Weight: 65.8 kg (145 lb) (09/13 1732)       Based on results and overall disposition I feel patient is stable for outpatient follow-up and reevaluation.  I discussed any results including lab work and imaging with patient and answered all questions.  They are advised to follow-up with PCP  and return to ED for any worsening or concerning symptoms. Discussed return precautions.      Clinical Impression:     ICD-10-CM    1. Hypoglycemia  E16.2          Medications: none    Disposition: Discharge    Condition: Stable    TIME: 19:25          Electronically signed by [id number], DO

## 2019-12-13 NOTE — Discharge Instructions (Signed)
Please be sure to eat and drink something in regards to using insulin.  Please call the PCP as needed, please return for any concerns.

## 2019-12-13 NOTE — ED Nursing Note (Signed)
Pt coached to drink  juice w/sugar packets. Tolerated well. [place] sandwich pending.

## 2021-01-30 DEATH — deceased

## 2023-07-10 IMAGING — MR RM COLUNA DORSAL
4 of 9 series · 20 of 48 positions shown · non-contrast
Comparison: none

[Series 3: T2 · sagittal · 6.0mm · 0.94mm/px · 1 of 5 slices shown (1 of 4)]
[im 1/5]
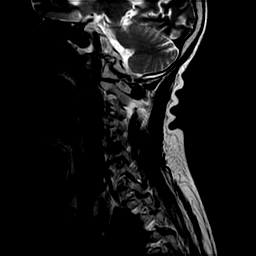

[Series 5: T2 · sagittal · 4.0mm · 0.68mm/px · 4 of 15 slices shown (2 of 4)]
[im 1/15]
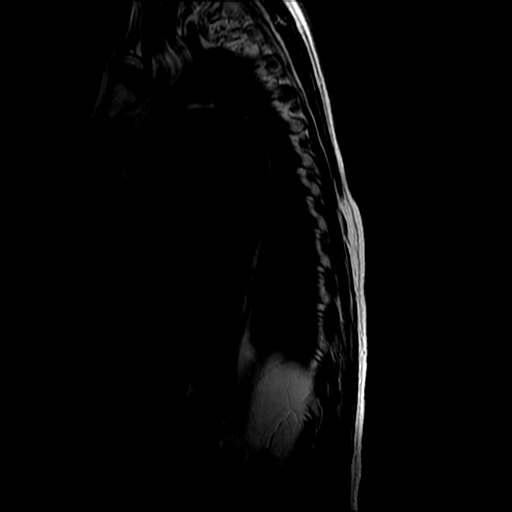
[im 5/15]
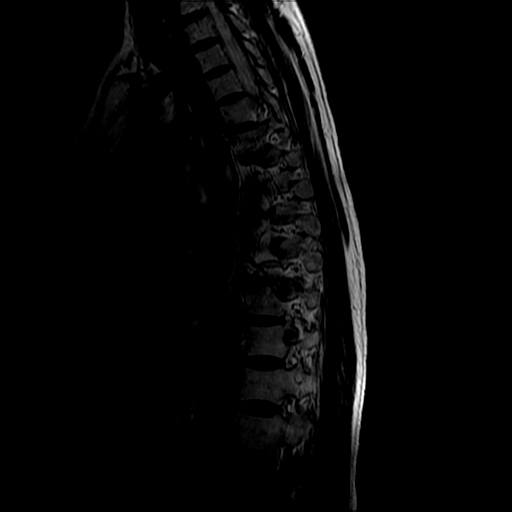
[im 10/15]
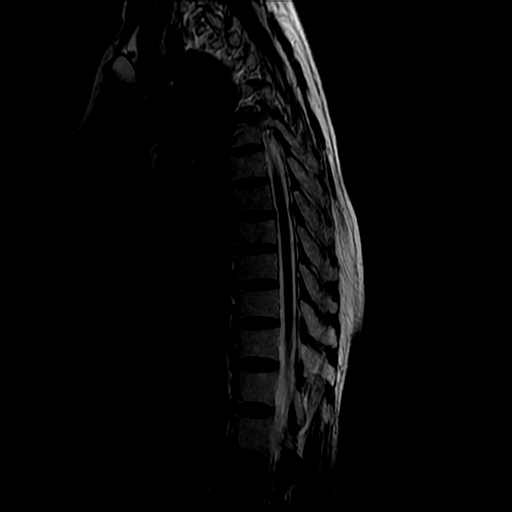
[im 15/15]
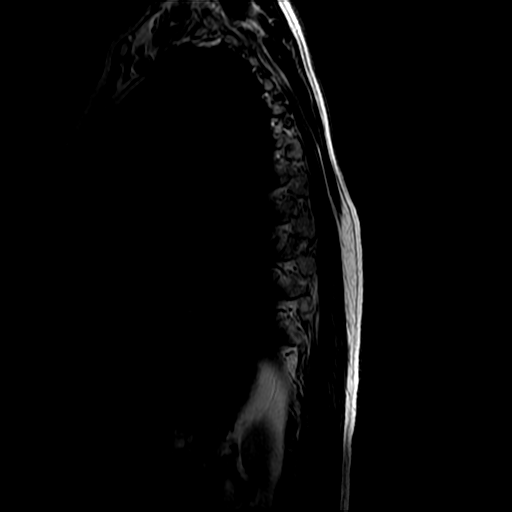

[Series 10: T2 · axial · 4.0mm · 0.39mm/px · z∈[-194,-31]mm · 11 of 40 slices shown (3 of 4)]
[im 1/40]
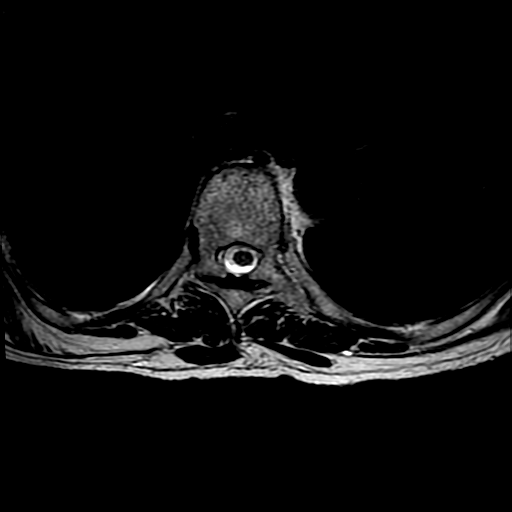
[im 4/40]
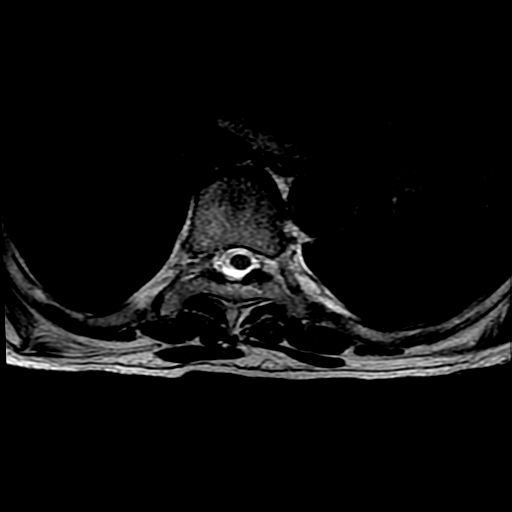
[im 8/40]
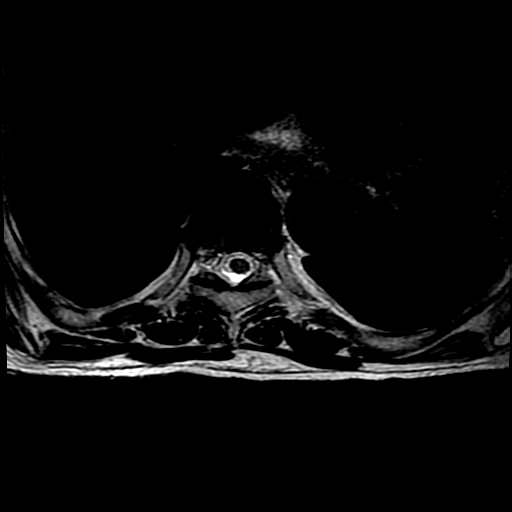
[im 12/40]
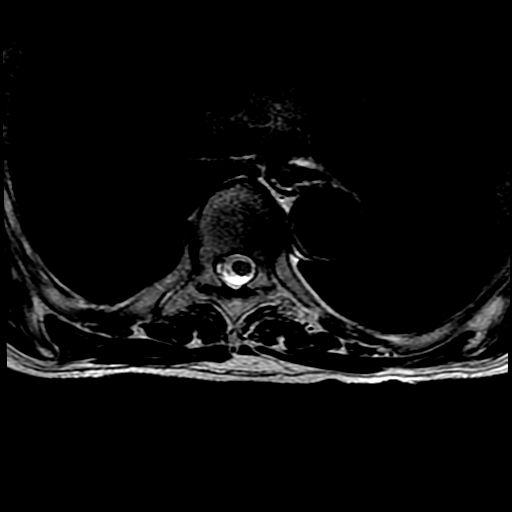
[im 16/40]
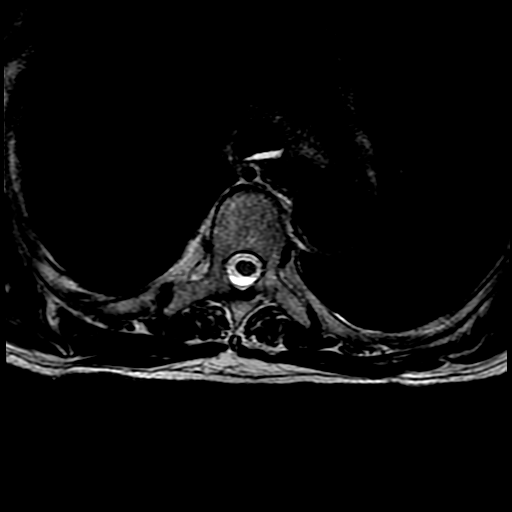
[im 20/40]
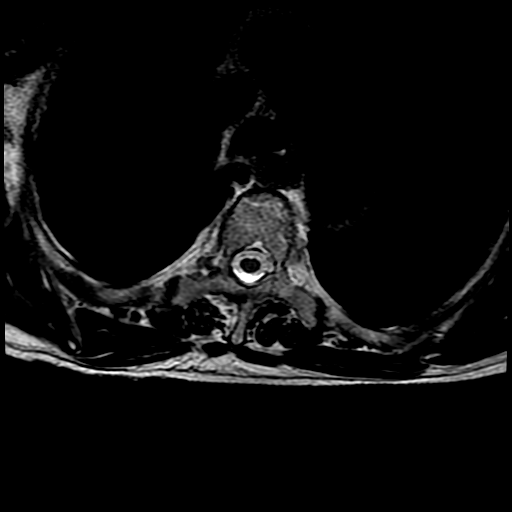
[im 24/40]
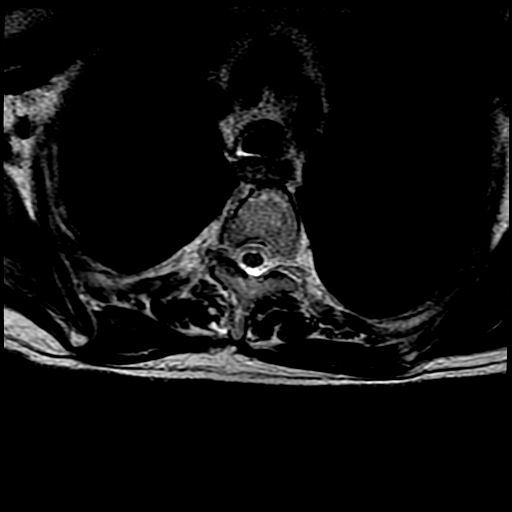
[im 28/40]
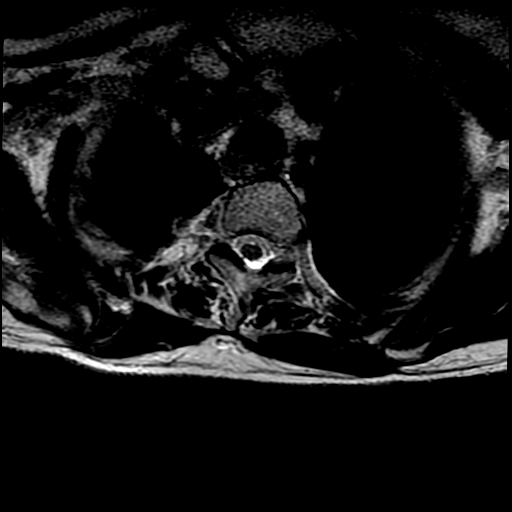
[im 32/40]
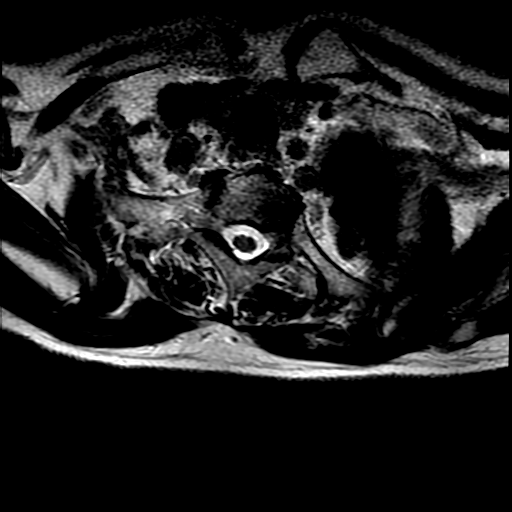
[im 36/40]
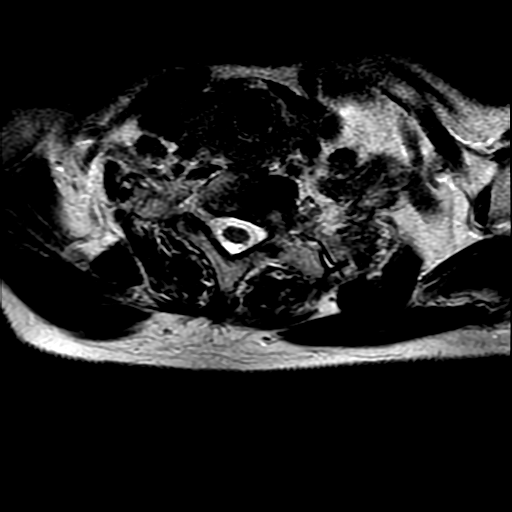
[im 40/40]
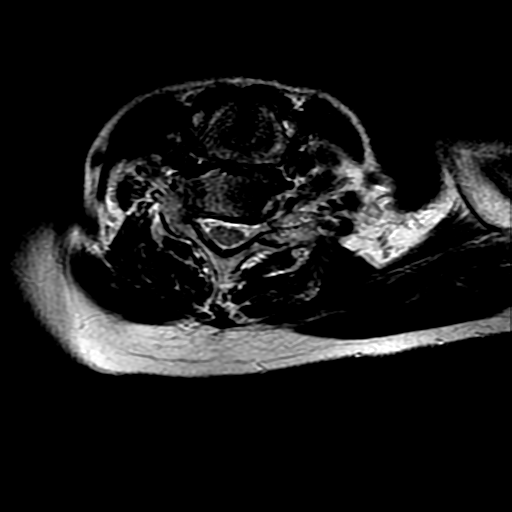

[Series 16: T2 · axial · 4.0mm · 0.39mm/px · z∈[-349,-191]mm · 4 of 42 slices shown (4 of 4)]
[im 1/42]
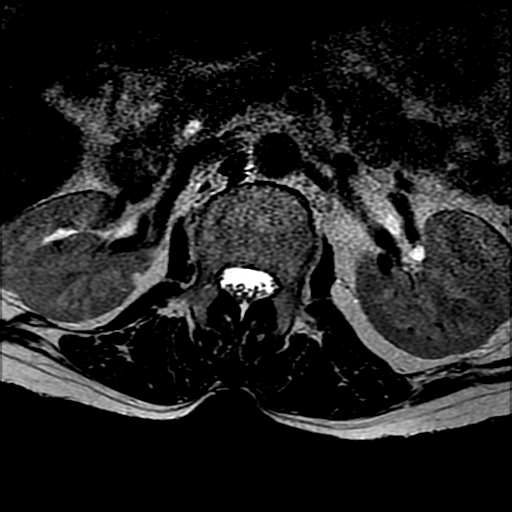
[im 5/42]
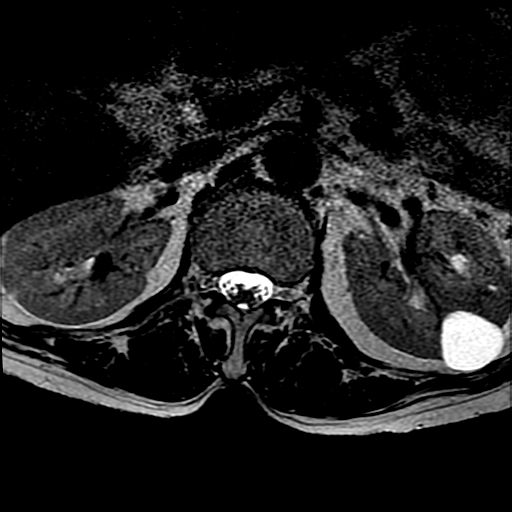
[im 21/42]
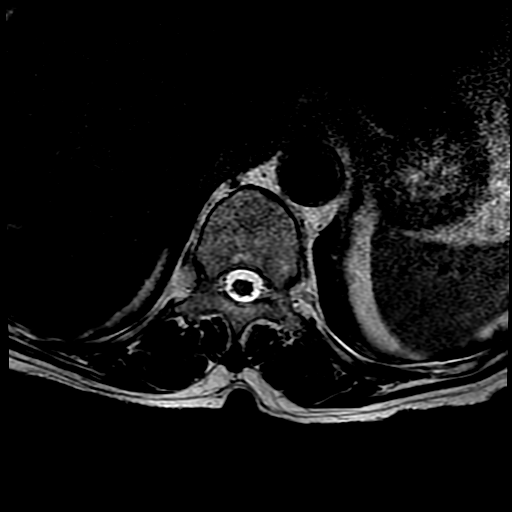
[im 37/42]
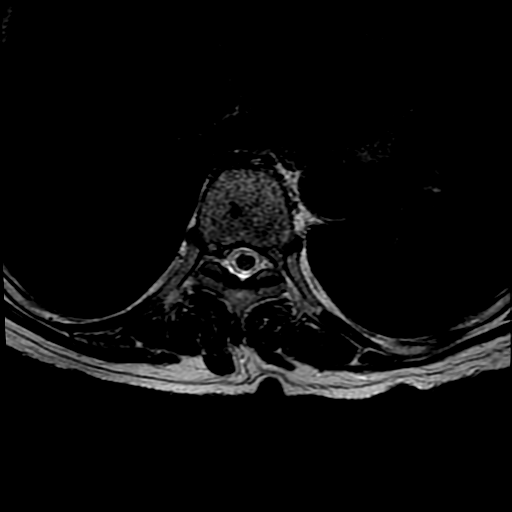

[20 of 48 positions shown; findings below may reference images not displayed]

RESSON[facility]IA [facility] COLU[facility] DORSAL

INDICAÇÃO CLÍNICA:
Dor.

METODOLOGIA:
Exame realizado em equipamento de alto campo, com técnicas ponderadas em T1 e T2, sem a injeção do meio de contraste paramagnético.

ANÁLISE:
Corpos vertebrais com altura e alinhamento posterior preservados, com osteófitos marginais.
Artropatia degenerativa nas interfacetárias e costovertebrais.
Degeneração discal dorsal.
Pequena protrusão discal póstero-central de T6-T7, sem conflitos mielorradiculares.
Canal vertebral de dimensões preservadas.
Forames neurais com dimensões dentro dos limites da normalidade.
Medula dorsal com morfologia e sinal normais.
Musculatura paravertebral posterior com troficidade habitual.

OPINIÃO:
Espondilodiscoartrose dorsal, destacando-se:
Pequena protrusão discal póstero-central de T6-T7, sem conflitos mielorradiculares.
Demais aspectos acima descritos.

## 2023-07-10 IMAGING — MR RM [facility] CERVICAL
4 of 5 series · 18 of 48 positions shown · non-contrast
Comparison: none

[Series 3: T2 · sagittal · 3.5mm · 0.47mm/px · 6 of 12 slices shown (1 of 2)]
[im 1/12]
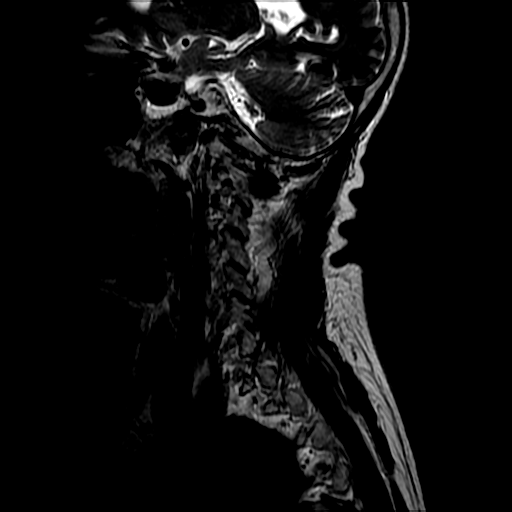
[im 3/12]
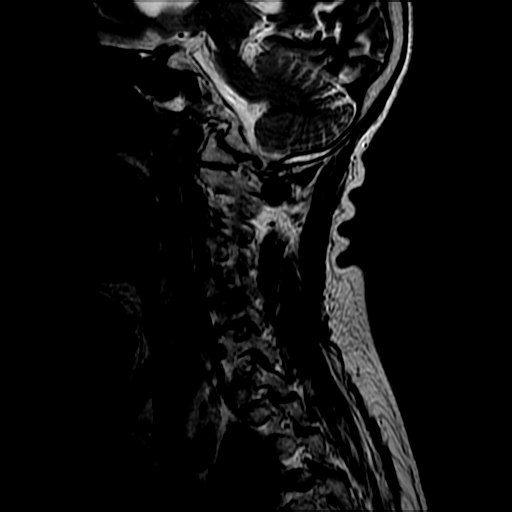
[im 5/12]
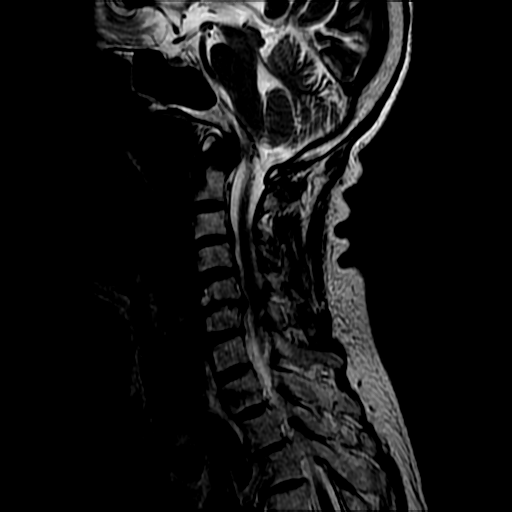
[im 7/12]
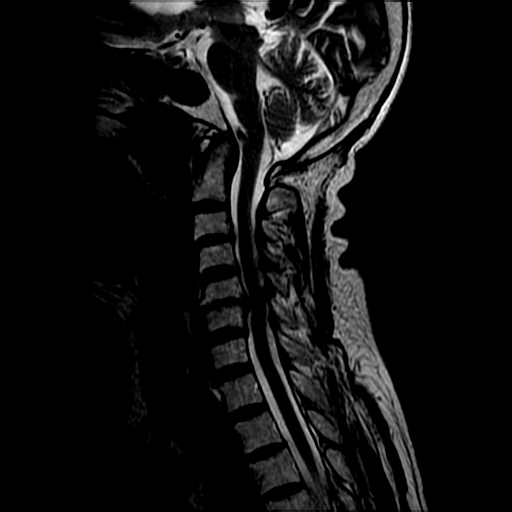
[im 9/12]
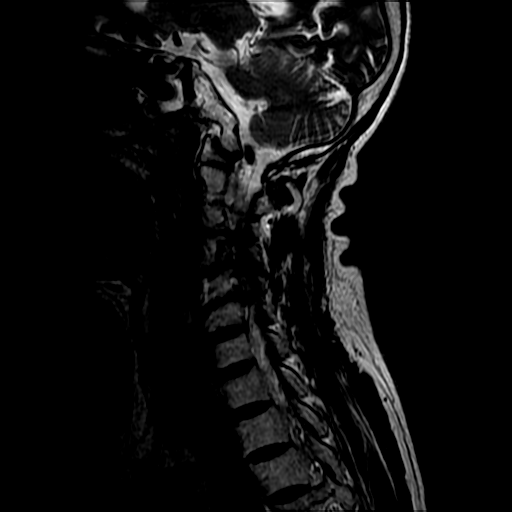
[im 12/12]
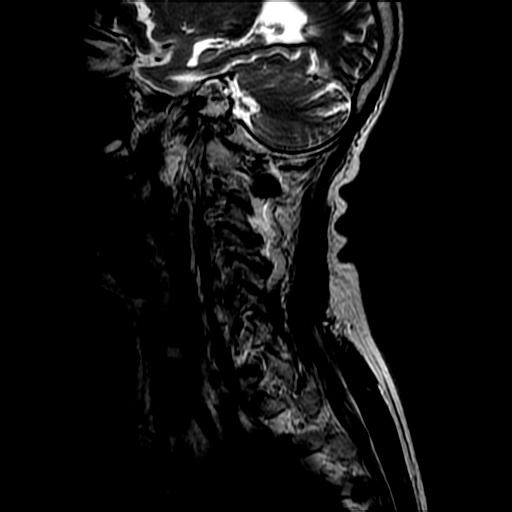

[Series 4: T1 · sagittal · 3.5mm · 0.47mm/px · 3 of 12 slices shown (1 of 2)]
[im 2/12]
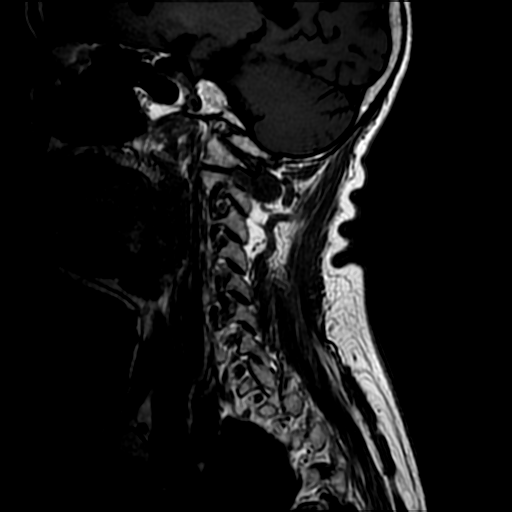
[im 6/12]
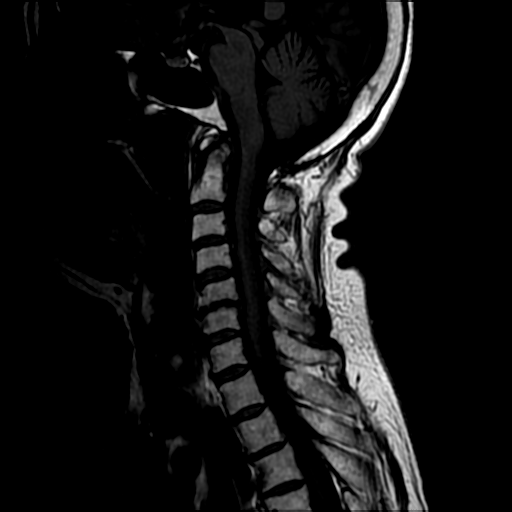
[im 10/12]
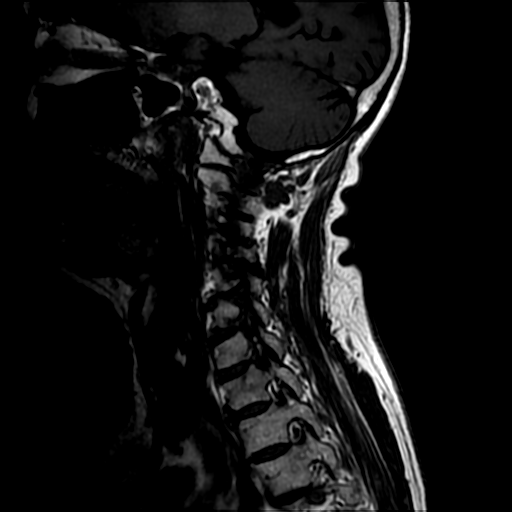

[Series 6: T2 · axial · 4.0mm · 0.39mm/px · z∈[-80,+7]mm · 6 of 25 slices shown (2 of 2)]
[im 1/25]
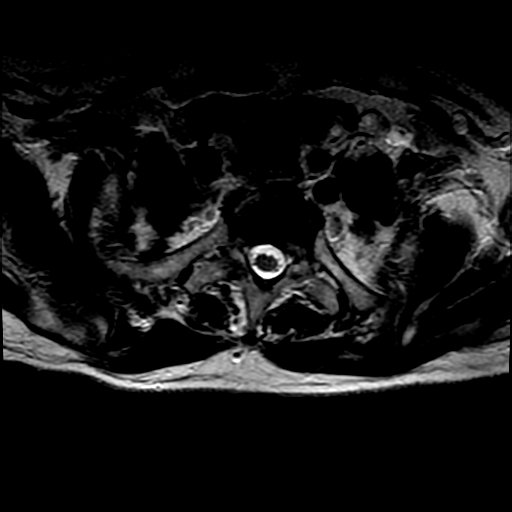
[im 4/25]
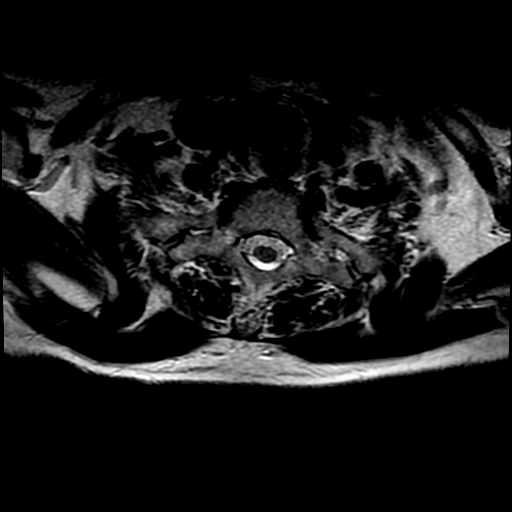
[im 8/25]
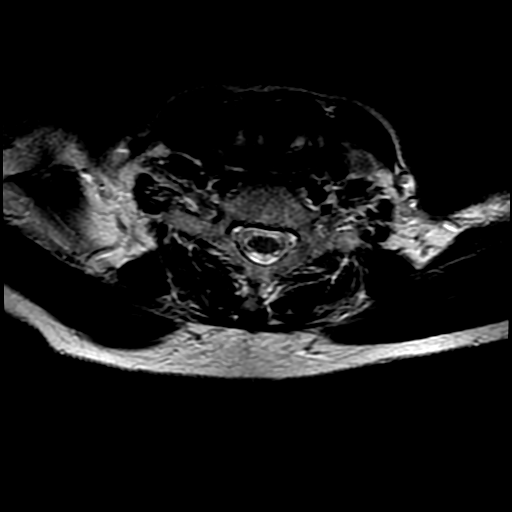
[im 12/25]
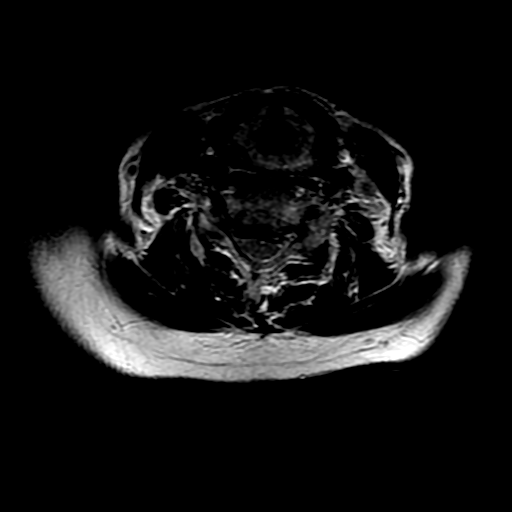
[im 13/25]
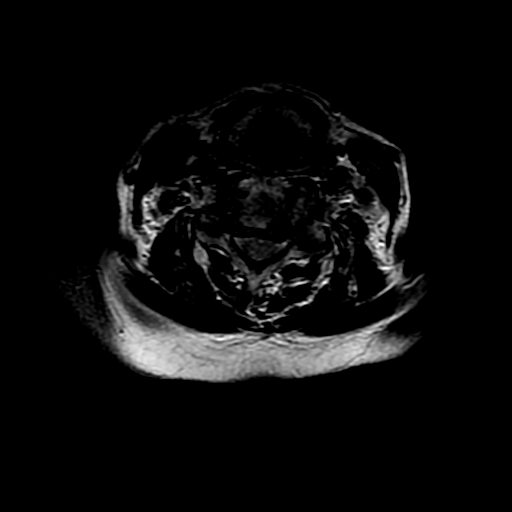
[im 21/25]
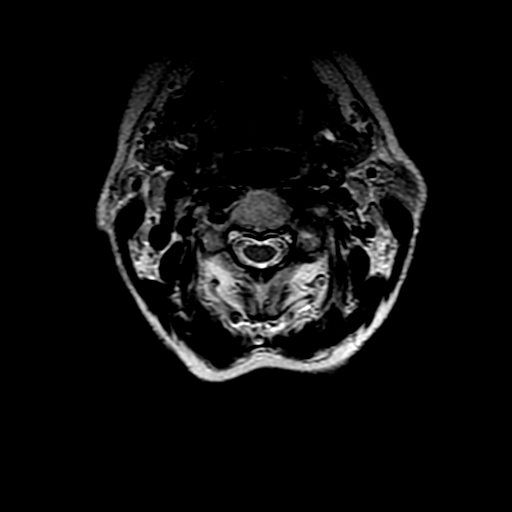

[Series 7: T1 · axial · 4.0mm · 0.39mm/px · z∈[-67,+7]mm · 3 of 25 slices shown (2 of 2)]
[im 4/25]
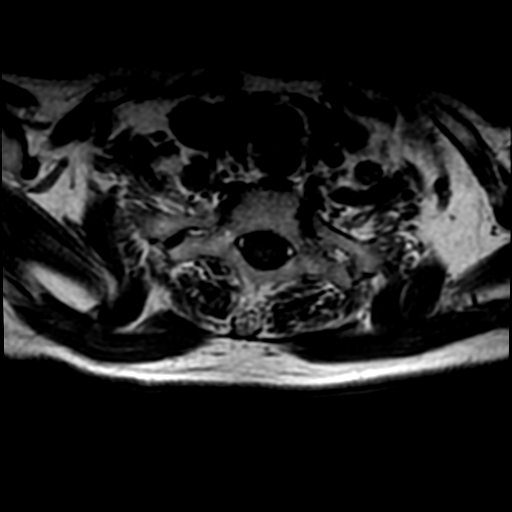
[im 13/25]
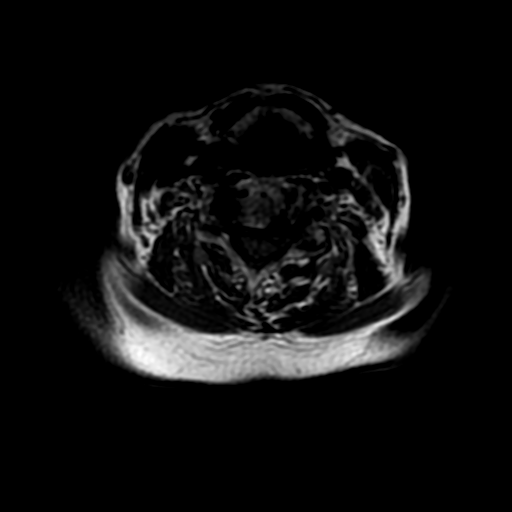
[im 21/25]
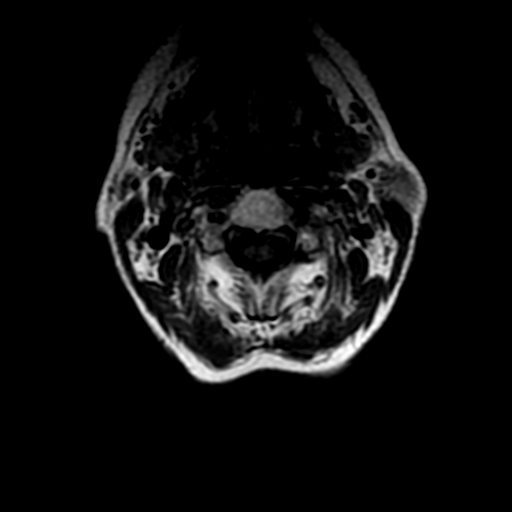

[18 of 48 positions shown; findings below may reference images not displayed]

[facility] CERVICAL

INDICAÇÃO CLÍNICA:
Dor.

METODOLOGIA:
Exame realizado em equipamento de alto campo, com técnicas ponderadas em T1 e T2, sem a injeção do meio de contraste paramagnético.

ANÁLISE:
Corpos vertebrais com altura e alinhamento posterior preservados, com osteófitos marginais.
Artropatia degenerativa interfacetária e uncovertebral em múltiplos níveis.
Degeneração discal cervical.
Abaulamento discal de C2-C3, sem conflitos radiculares.
Complexo disco-osteofitário de C3-C4, que associado a uncoartrose e artrose interfacetária, comprime a raiz emergente à esquerda e toca a face ventral da medula.
Complexo disco-osteofitário de C4-C5, que associado a uncoartrose e artrose interfacetária, toca a raiz emergente à esquerda e a face ventral da medula.
Complexo disco-osteofitário de C5-C6, que associado a uncoartrose e artrose interfacetária, comprime as raízes emergentes bilateralmente e a face ventral da medula.
Complexo disco-osteofitário de C6-C7, que associado a uncoartrose e artrose interfacetária, comprime a raiz emergente à direita.
A medula cervical tem morfologia e intensidade de sinal preservados.
Aspecto normal da musculatura cervical.

OPINIÃO:
Espondilodiscoartrose cervical, destacando-se:
Abaulamento discal de C2-C3, sem conflitos radiculares.
Complexo disco-osteofitário de C3-C4, que associado a uncoartrose e artrose interfacetária, comprime a raiz emergente à esquerda e toca a face ventral da medula.
Complexo disco-osteofitário de C4-C5, que associado a uncoartrose e artrose interfacetária, toca a raiz emergente à esquerda e a face ventral da medula.
Complexo disco-osteofitário de C5-C6, que associado a uncoartrose e artrose interfacetária, comprime as raízes emergentes bilateralmente e a face ventral da medula.
Complexo disco-osteofitário de C6-C7, que associado a uncoartrose e artrose interfacetária, comprime a raiz emergente à direita.
Demais aspectos acima descritos.

## 2023-07-10 IMAGING — MR RM COLUNA LOMBAR
4 of 6 series · 19 of 48 positions shown · non-contrast
Comparison: none

[Series 5: T2 · sagittal · 4.0mm · 0.59mm/px · 6 of 12 slices shown (1 of 2)]
[im 1/12]
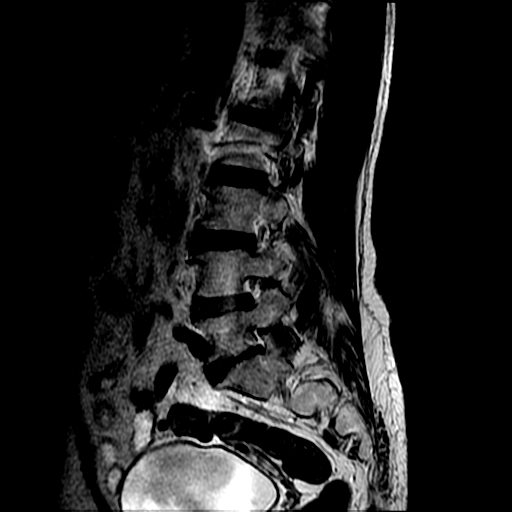
[im 3/12]
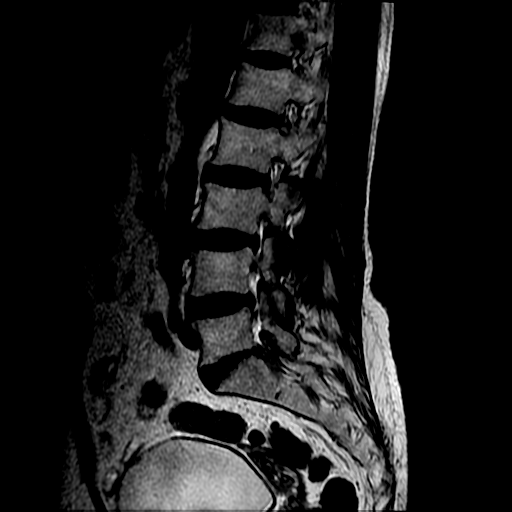
[im 5/12]
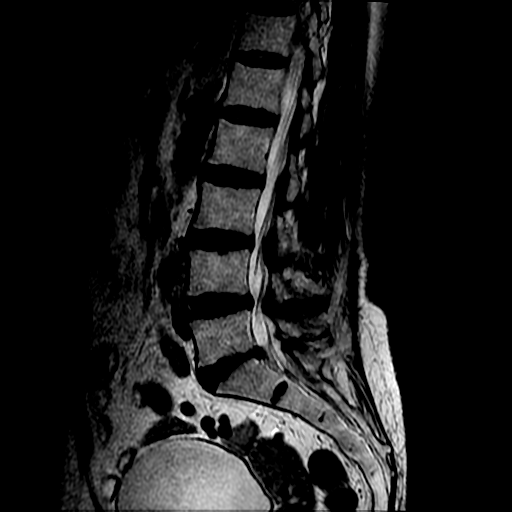
[im 7/12]
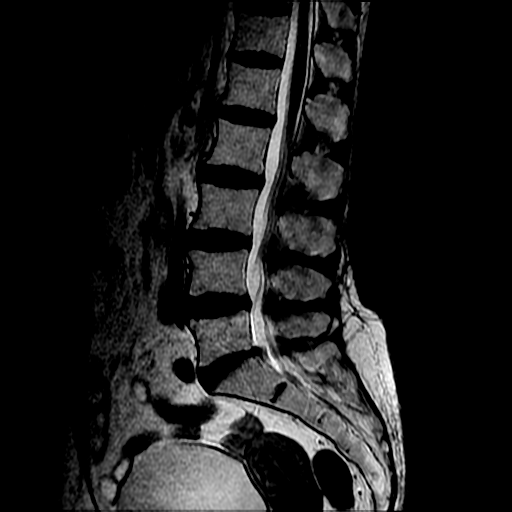
[im 9/12]
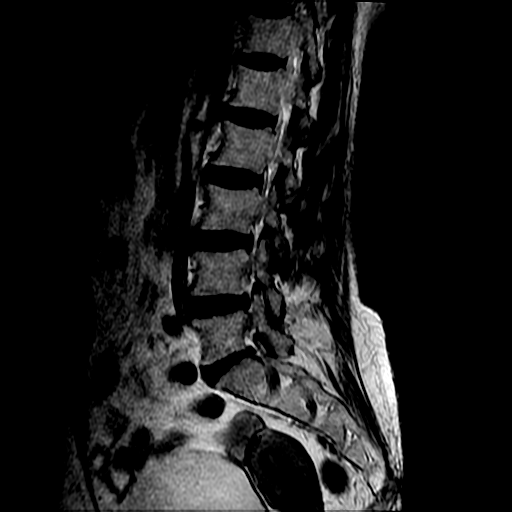
[im 12/12]
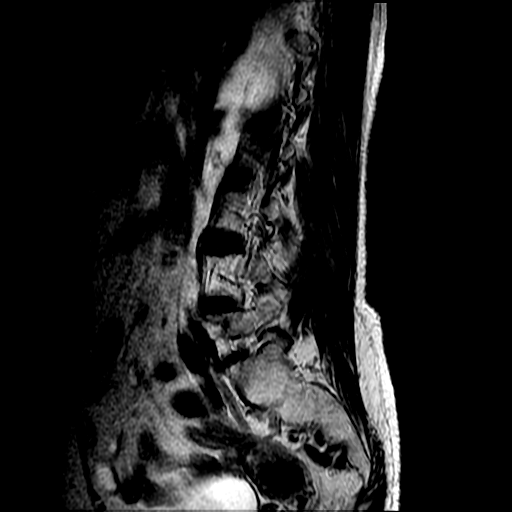

[Series 7: T1 · sagittal · 4.0mm · 0.59mm/px · 3 of 12 slices shown (1 of 2)]
[im 3/12]
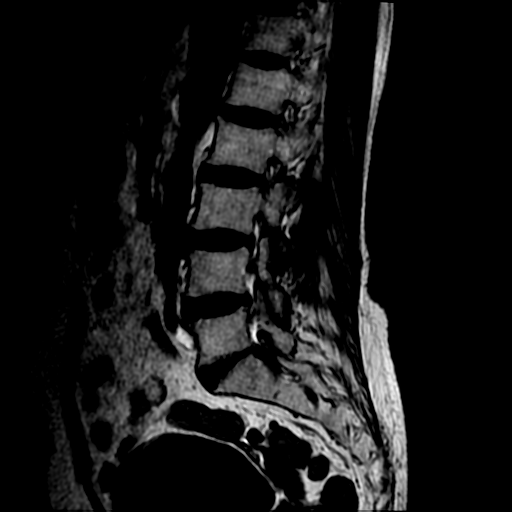
[im 7/12]
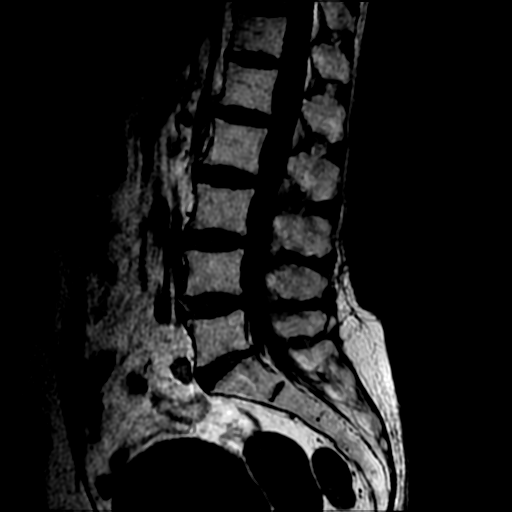
[im 12/12]
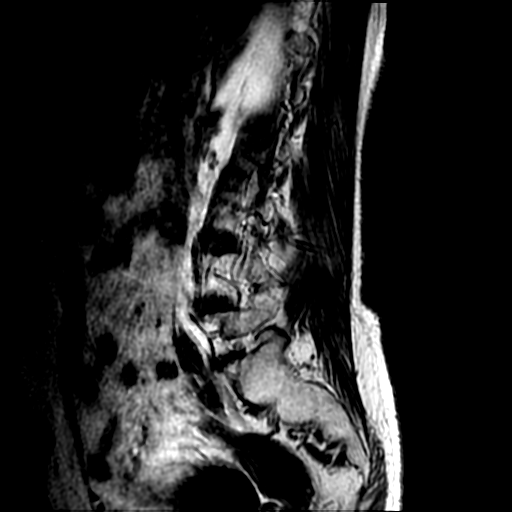

[Series 9: T2 · axial · 4.0mm · 0.39mm/px · z∈[-530,-338]mm · 7 of 25 slices shown (2 of 2)]
[im 1/25]
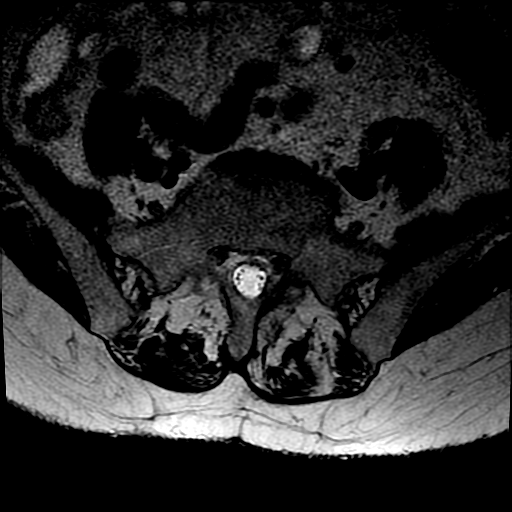
[im 5/25]
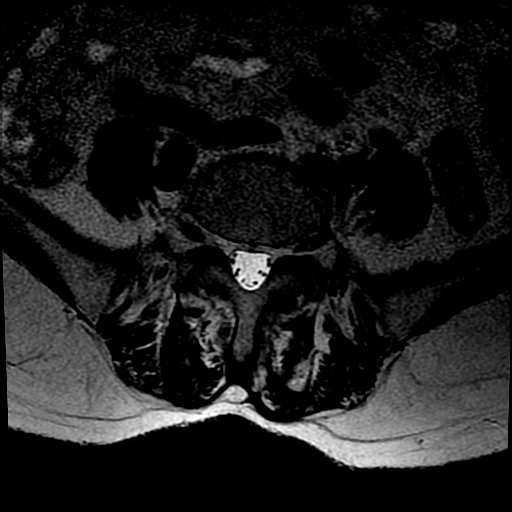
[im 7/25]
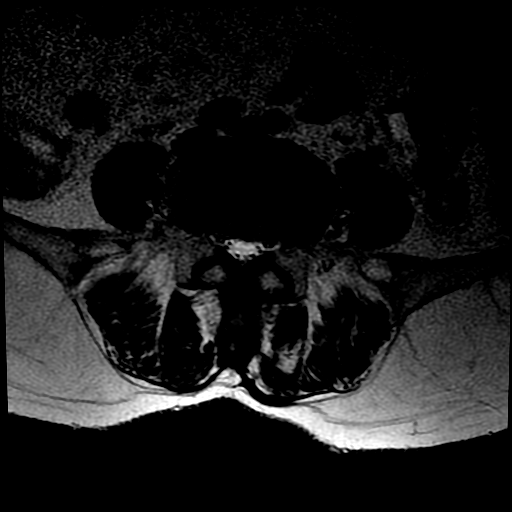
[im 11/25]
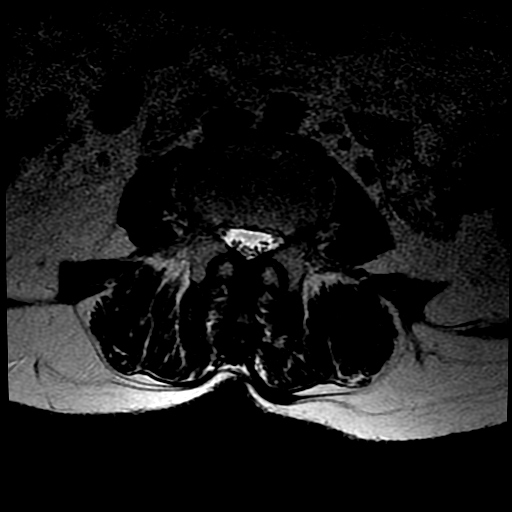
[im 14/25]
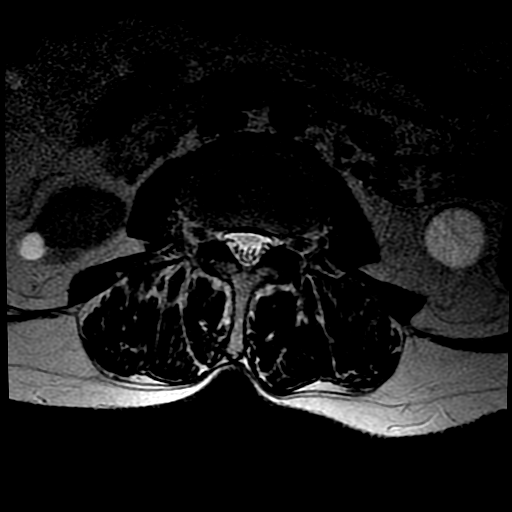
[im 18/25]
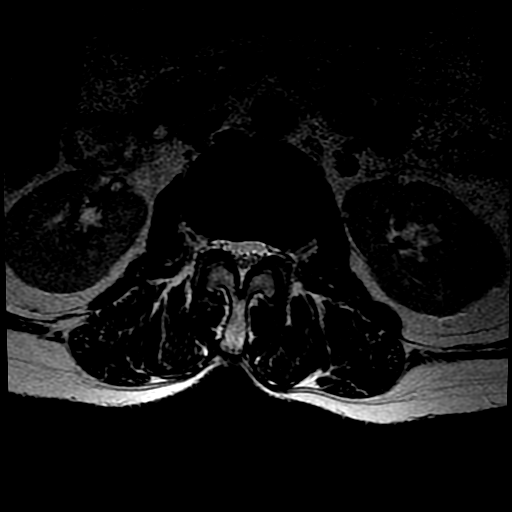
[im 22/25]
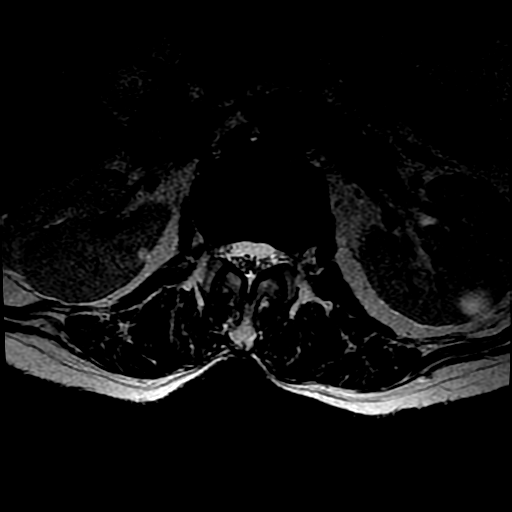

[Series 10: T1 · axial · 4.0mm · 0.39mm/px · z∈[-514,-338]mm · 3 of 25 slices shown (2 of 2)]
[im 5/25]
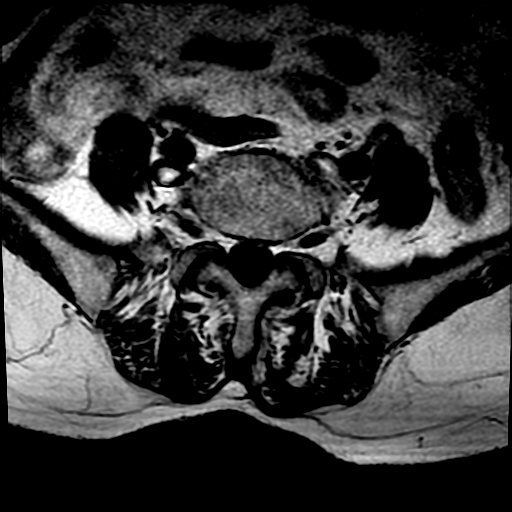
[im 14/25]
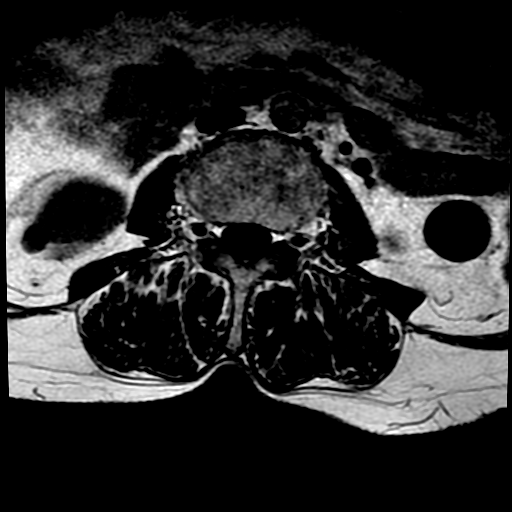
[im 22/25]
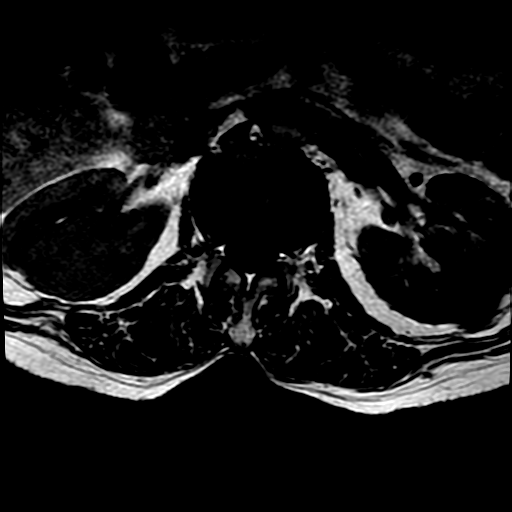

[19 of 48 positions shown; findings below may reference images not displayed]

RESSON[facility]NCIA [facility] LOMBAR

INDICAÇÃO CLÍNICA:
Radiculopatia.

METODOLOGIA:
Exame realizado em equipamento de alto campo, com técnicas ponderadas em T1 e T2, sem a injeção do meio de contraste paramagnético.

ANÁLISE:
Corpos vertebrais com altura e alinhamento posterior preservados, com osteófitos marginais.
Artropatia degenerativa nas interfacetárias lombares.
Degeneração discal lombar.
Abaulamentos discais de L2-L3 a L4-L5, sem conflitos radiculares.
Complexo disco-osteofitário de L5-S1, que associado a artrose interfacetária, toca as raízes emergentes e descendentes bilateralmente.
Canal vertebral de dimensões preservadas.
Demais forames neurais com dimensões dentro dos limites da normalidade.
O cone medular é tópico, de morfologia e intensidade de sinal preservados.
Distribuição anatômica das raízes da cauda equina.
Musculatura paravertebral posterior com troficidade habitual.

OPINIÃO:
Espondilodiscoartrose lombar, destacando-se:
Abaulamentos discais de L2-L3 a L4-L5, sem conflitos radiculares.
Complexo disco-osteofitário de L5-S1, que associado a artrose interfacetária, toca as raízes emergentes e descendentes bilateralmente.
Demais aspectos acima descritos.

Obs: Imagens nodulares renais bilaterais, de provável aspecto cístico.
